# Patient Record
Sex: Female | Born: 1980 | Race: White | Hispanic: No | Marital: Single | Smoking: Never smoker
Health system: Southern US, Community
[De-identification: ages and names within clinical notes are randomized; demographics above are authoritative.]

## PROBLEM LIST (undated history)

## (undated) DIAGNOSIS — I639 Cerebral infarction, unspecified: Secondary | ICD-10-CM

---

## 2018-06-23 DIAGNOSIS — I6350 Cerebral infarction due to unspecified occlusion or stenosis of unspecified cerebral artery: Secondary | ICD-10-CM

## 2018-06-24 DIAGNOSIS — I679 Cerebrovascular disease, unspecified: Secondary | ICD-10-CM

## 2018-06-24 DIAGNOSIS — I639 Cerebral infarction, unspecified: Secondary | ICD-10-CM

## 2018-06-24 DIAGNOSIS — I6789 Other cerebrovascular disease: Secondary | ICD-10-CM | POA: Diagnosis not present

## 2018-06-25 DIAGNOSIS — I639 Cerebral infarction, unspecified: Secondary | ICD-10-CM | POA: Diagnosis not present

## 2018-06-25 DIAGNOSIS — I679 Cerebrovascular disease, unspecified: Secondary | ICD-10-CM | POA: Diagnosis not present

## 2019-09-10 ENCOUNTER — Emergency Department
Admission: EM | Admit: 2019-09-10 | Discharge: 2019-09-11 | Disposition: A | Discharge status: Home / Self Care | Payer: Medicaid Other | Attending: Emergency Medicine | Admitting: Emergency Medicine

## 2019-09-10 ENCOUNTER — Emergency Department: Payer: Medicaid Other

## 2019-09-10 ENCOUNTER — Other Ambulatory Visit: Payer: Self-pay

## 2019-09-10 DIAGNOSIS — F1910 Other psychoactive substance abuse, uncomplicated: Secondary | ICD-10-CM | POA: Insufficient documentation

## 2019-09-10 DIAGNOSIS — R4182 Altered mental status, unspecified: Secondary | ICD-10-CM | POA: Insufficient documentation

## 2019-09-10 DIAGNOSIS — T438X1A Poisoning by other psychotropic drugs, accidental (unintentional), initial encounter: Secondary | ICD-10-CM | POA: Diagnosis present

## 2019-09-10 LAB — COMPREHENSIVE METABOLIC PANEL
ALT: 26 U/L (ref 0–44)
AST: 29 U/L (ref 15–41)
Albumin: 3.5 g/dL (ref 3.5–5.0)
Alkaline Phosphatase: 147 U/L — ABNORMAL HIGH (ref 38–126)
Anion gap: 7 (ref 5–15)
BUN: 16 mg/dL (ref 6–20)
CO2: 29 mmol/L (ref 22–32)
Calcium: 9.1 mg/dL (ref 8.9–10.3)
Chloride: 105 mmol/L (ref 98–111)
Creatinine, Ser: 0.9 mg/dL (ref 0.44–1.00)
GFR calc Af Amer: >60 mL/min (ref >60)
GFR calc non Af Amer: >60 mL/min (ref >60)
Glucose, Bld: 127 mg/dL — ABNORMAL HIGH (ref 70–99)
Potassium: 4 mmol/L (ref 3.5–5.1)
Sodium: 141 mmol/L (ref 135–145)
Total Bilirubin: 0.9 mg/dL (ref 0.3–1.2)
Total Protein: 6.9 g/dL (ref 6.5–8.1)

## 2019-09-10 LAB — CBC
HCT: 37.4 % (ref 36.0–46.0)
Hemoglobin: 11.8 g/dL — ABNORMAL LOW (ref 12.0–15.0)
MCH: 27.2 pg (ref 26.0–34.0)
MCHC: 31.6 g/dL (ref 30.0–36.0)
MCV: 86.2 fL (ref 80.0–100.0)
Platelets: 269 10*3/uL (ref 150–400)
RBC: 4.34 MIL/uL (ref 3.87–5.11)
RDW: 14.2 % (ref 11.5–15.5)
WBC: 6.8 10*3/uL (ref 4.0–10.5)
nRBC: 0 % (ref 0.0–0.2)

## 2019-09-10 LAB — ACETAMINOPHEN LEVEL: Acetaminophen (Tylenol), Serum: <10 ug/mL — ABNORMAL LOW (ref 10–30)

## 2019-09-10 LAB — ETHANOL: Alcohol, Ethyl (B): <10 mg/dL (ref <10)

## 2019-09-10 LAB — SALICYLATE LEVEL: Salicylate Lvl: <7 mg/dL (ref 2.8–30.0)

## 2019-09-10 IMAGING — CT CT CERVICAL SPINE W/O CM
3 of 4 series · 9 of 33 positions shown, 10 images · non-contrast
Comparison: [DATE]

CLINICAL DATA: Altered level consciousness, reports of overdose

EXAM:
CT HEAD WITHOUT CONTRAST
TECHNIQUE: Contiguous axial images were obtained from the base of the skull
through the vertex without intravenous contrast.

[Series 6: sagittal bone · sagittal · 0.20mm/px · 5 of 62 slices shown]
[im 21/62  bone]
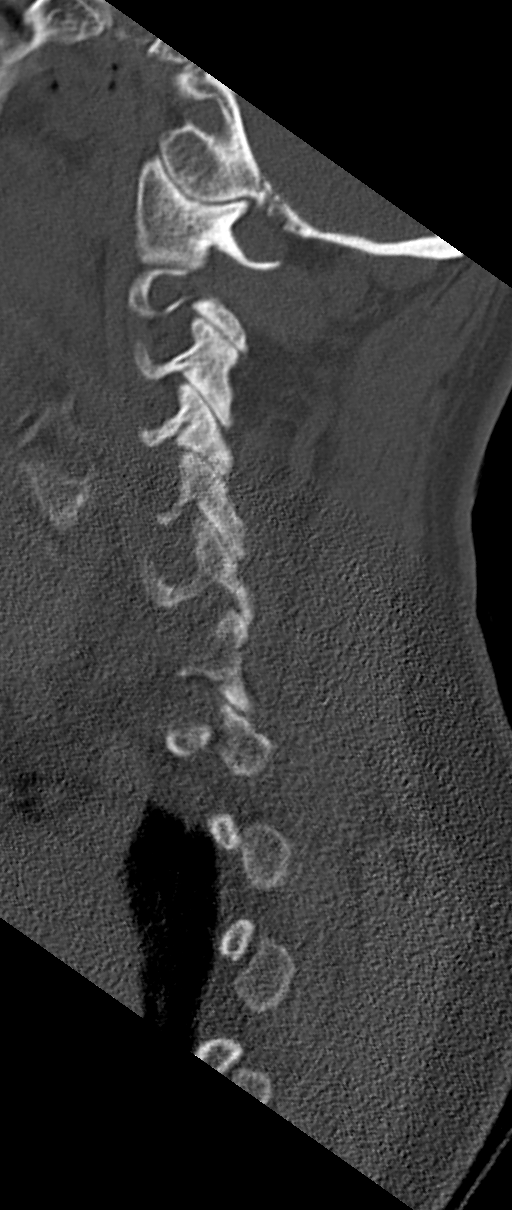
[im 26/62  bone]
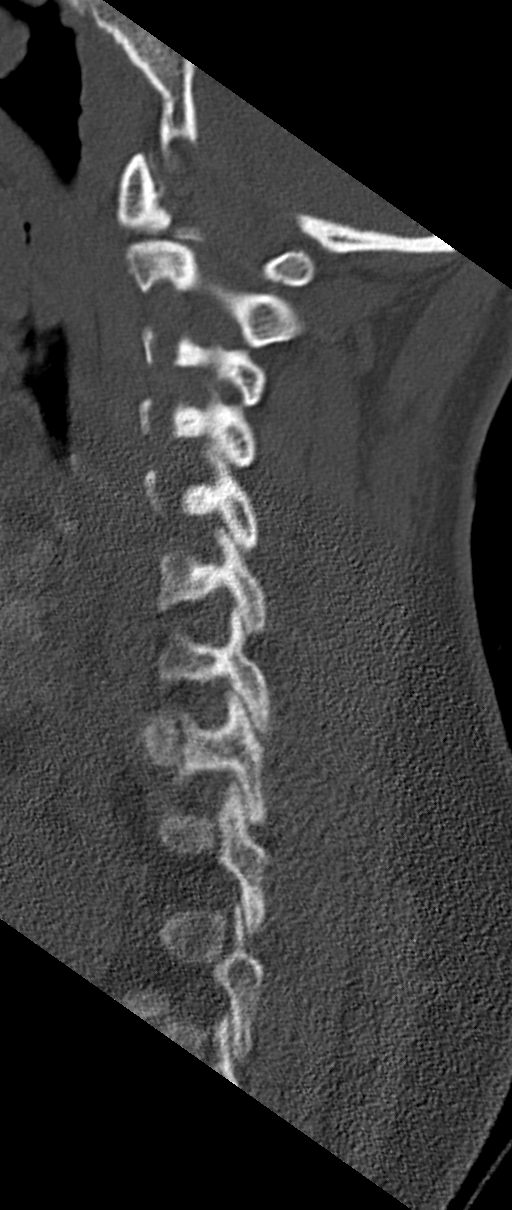
[im 31/62  bone]
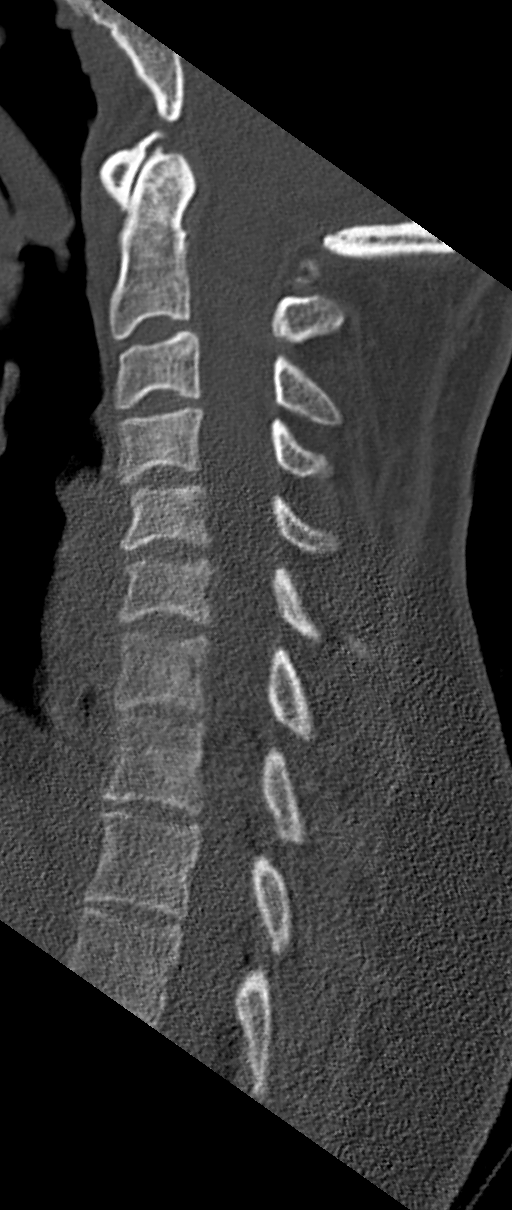
[im 36/62  bone]
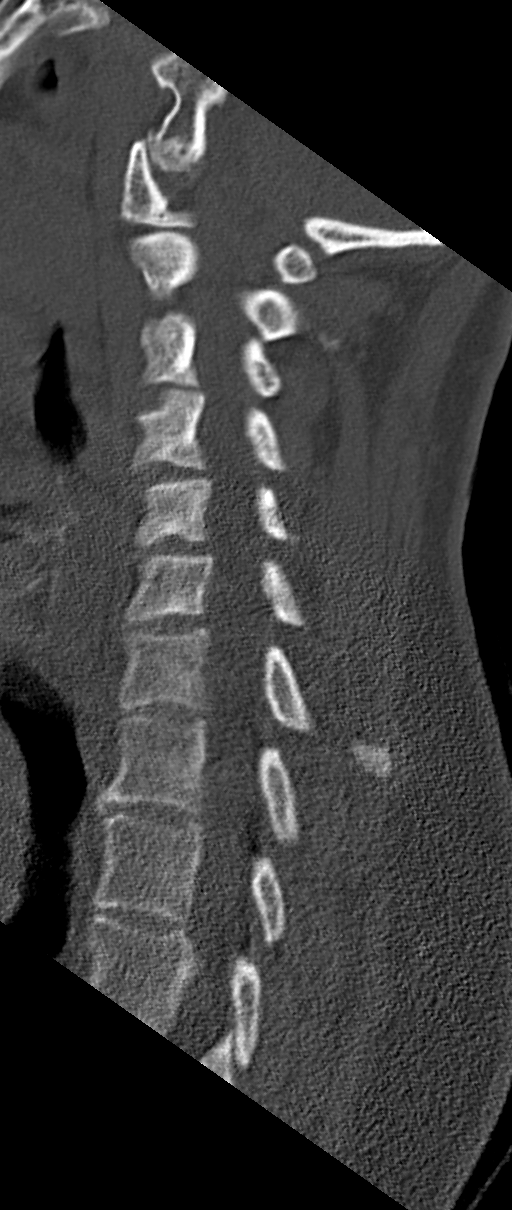
[im 41/62  bone]
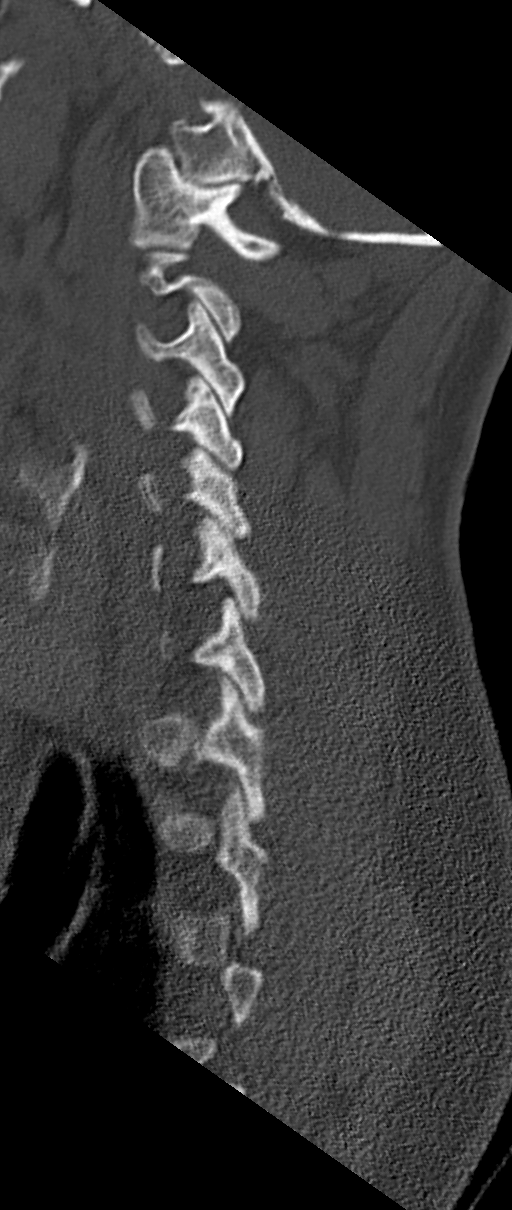

[Series 7: coronal bone · coronal · 0.24mm/px · 3 of 52 slices shown]
[im 13/52  bone]
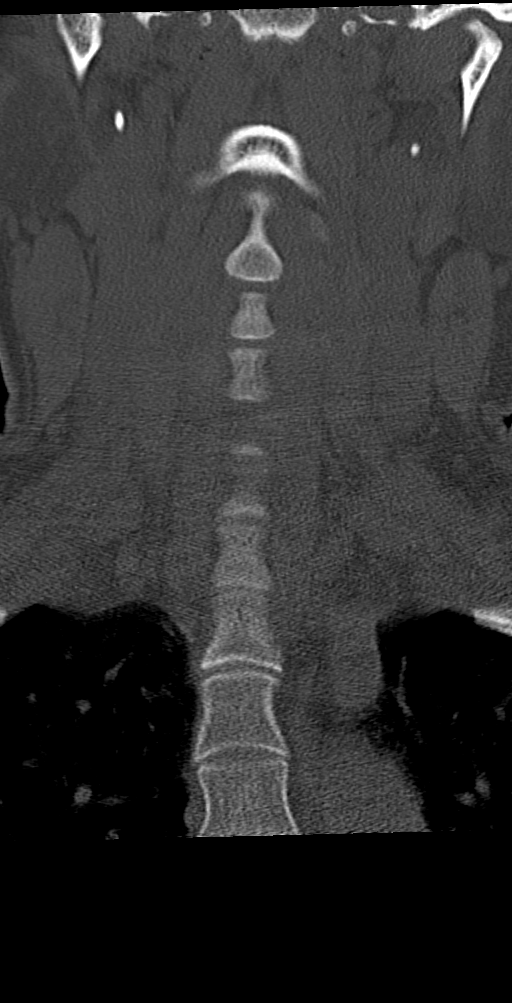
[im 22/52  bone]
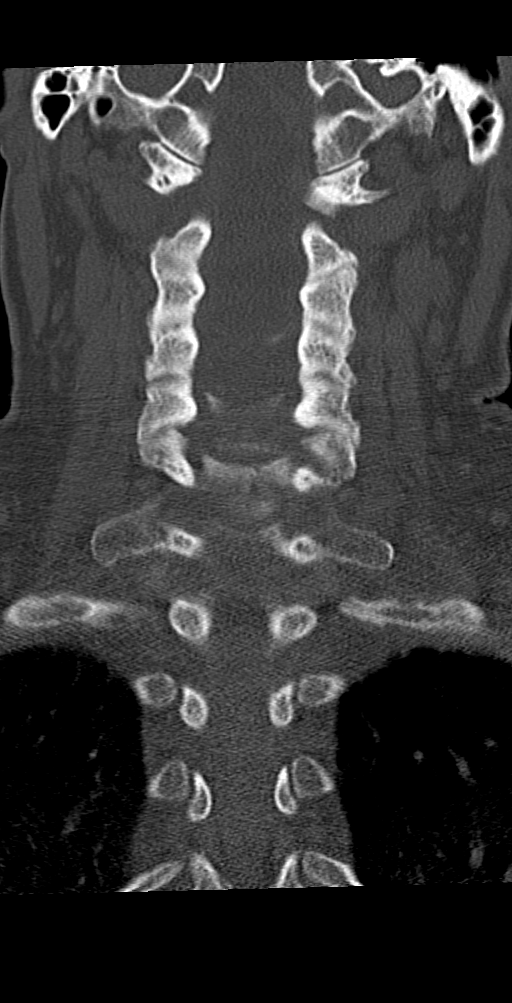
[im 30/52  bone]
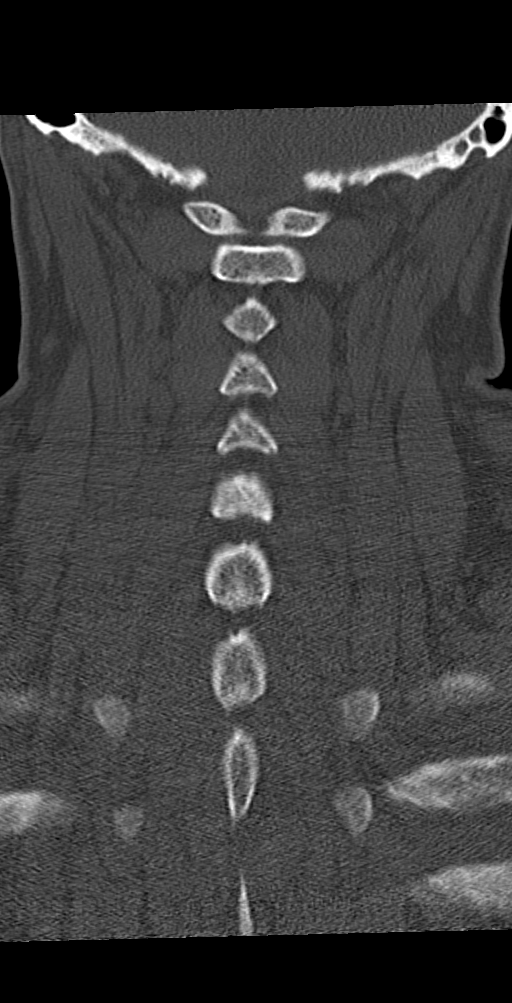

[Series 8: orthogonal bone · axial · 0.20mm/px · z∈[+229,+229]mm · 1 of 121 slices shown, 2 images]
[im 69/121  soft-tissue]
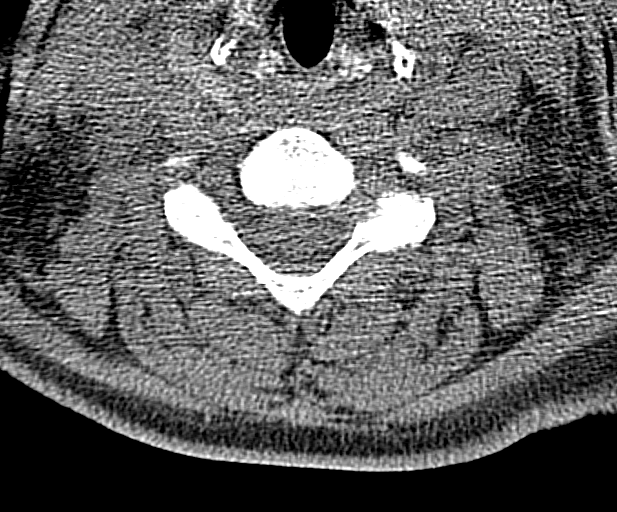
[im 69/121  bone]
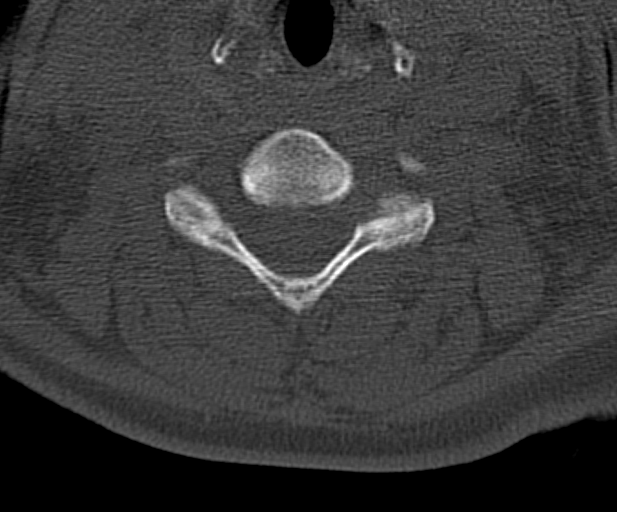

[9 of 33 positions shown; findings below may reference images not displayed]

FINDINGS: Brain: No evidence of acute territorial infarction, hemorrhage,
hydrocephalus,extra-axial collection or mass lesion/mass effect.
Normal gray-white differentiation. Ventricles are normal in size and
contour.

Vascular: No hyperdense vessel or unexpected calcification.

Skull: The skull is intact. No fracture or focal lesion identified.

Sinuses/Orbits: Ethmoid air cell mucosal thickening is seen. The
orbits and globes intact.

Other: None

Cervical spine:

Alignment: Straightening of the normal cervical lordosis.

Skull base and vertebrae: Visualized skull base is intact. No
atlanto-occipital dissociation. The vertebral body heights are well
maintained. No fracture or pathologic osseous lesion seen.

Soft tissues and spinal canal: The visualized paraspinal soft
tissues are unremarkable. No prevertebral soft tissue swelling is
seen. The spinal canal is grossly unremarkable, no large epidural
collection or significant canal narrowing.

Disc levels:  No significant canal or neural foraminal narrowing.

Upper chest: The lung apices are clear. Thoracic inlet is within
normal limits.

Other: None
IMPRESSION: 1. No acute intracranial abnormality.
2.  No acute fracture or malalignment of the spine.
3. Ethmoid air cell mucosal thickening

## 2019-09-10 IMAGING — CT CT HEAD W/O CM
3 series · 15 of 47 positions shown, 18 images · non-contrast
Comparison: [DATE]

CLINICAL DATA: Altered level consciousness, reports of overdose

EXAM:
CT HEAD WITHOUT CONTRAST
TECHNIQUE: Contiguous axial images were obtained from the base of the skull
through the vertex without intravenous contrast.

[Series 3: head wo · axial · 0.42mm/px · z∈[+341,+466]mm · 9 of 30 slices shown, 12 images]
[im 3/30  brain]
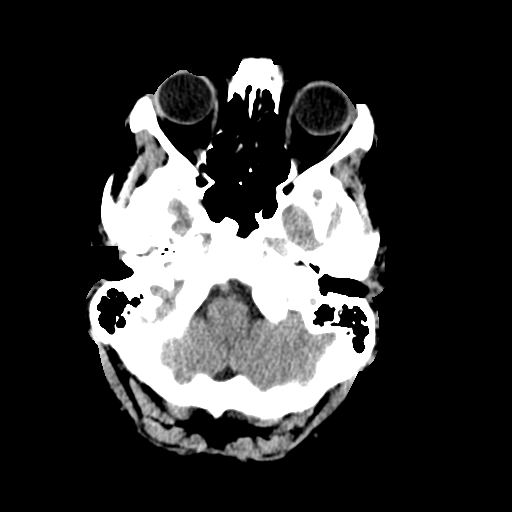
[im 3/30  bone]
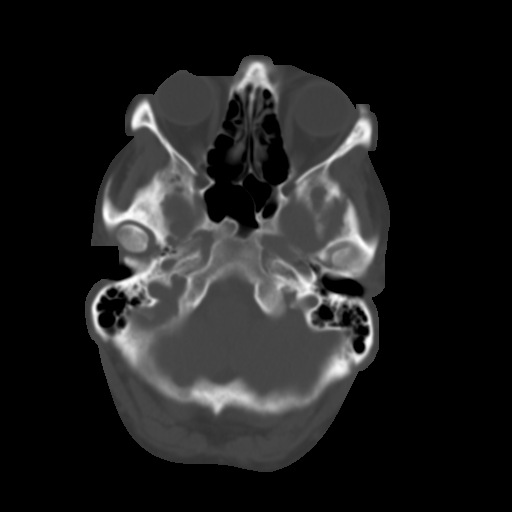
[im 6/30  brain]
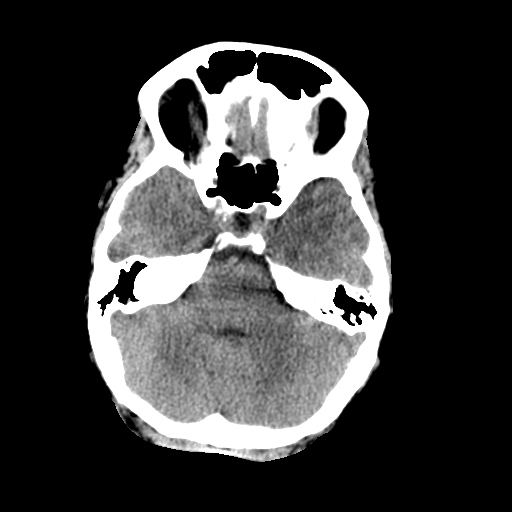
[im 9/30  brain]
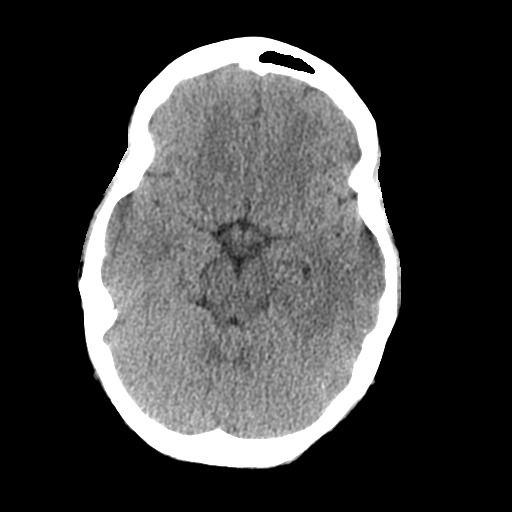
[im 12/30  brain]
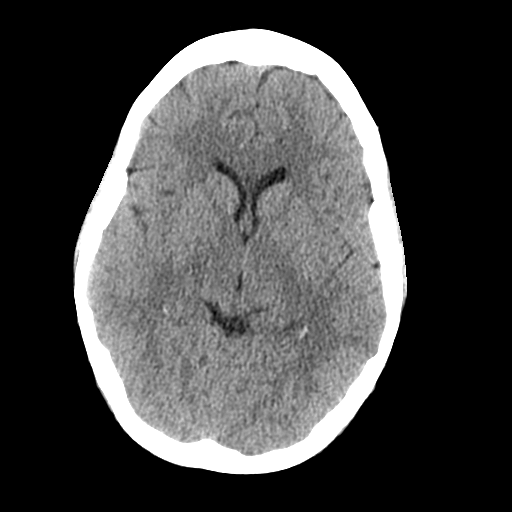
[im 16/30  brain]
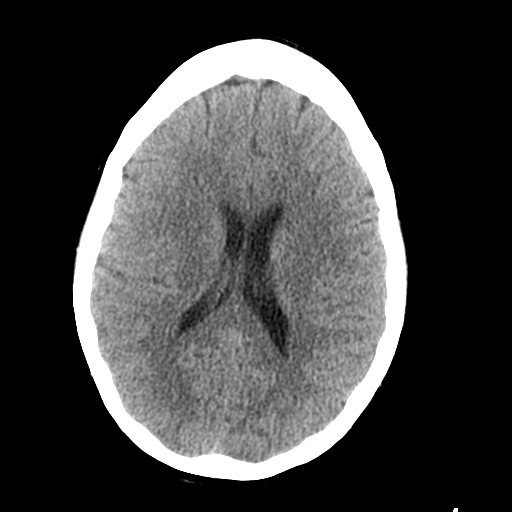
[im 16/30  bone]
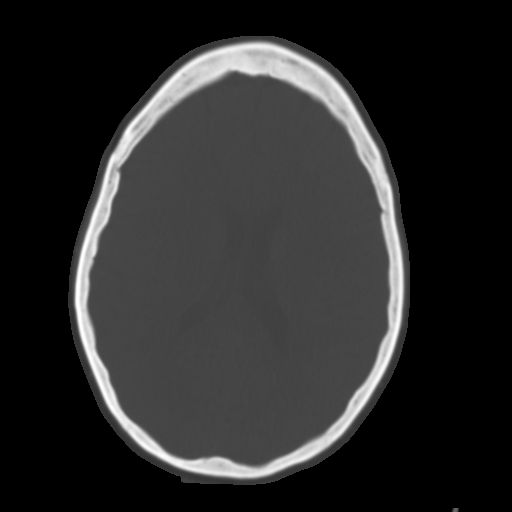
[im 19/30  brain]
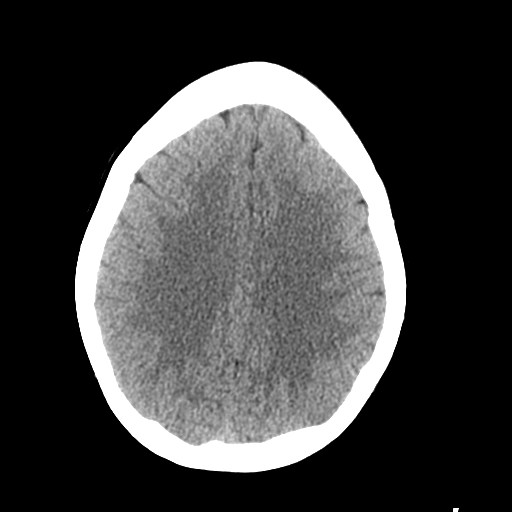
[im 22/30  brain]
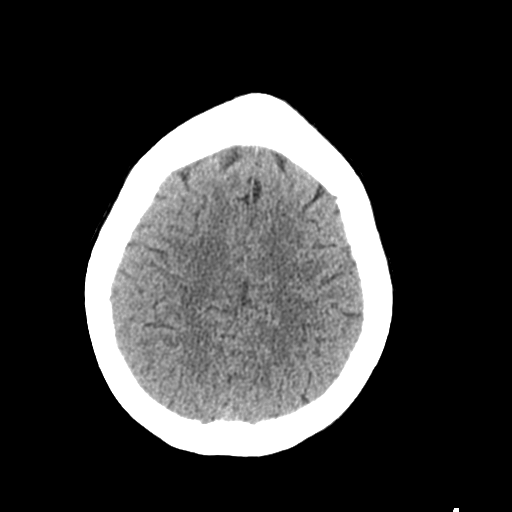
[im 25/30  brain]
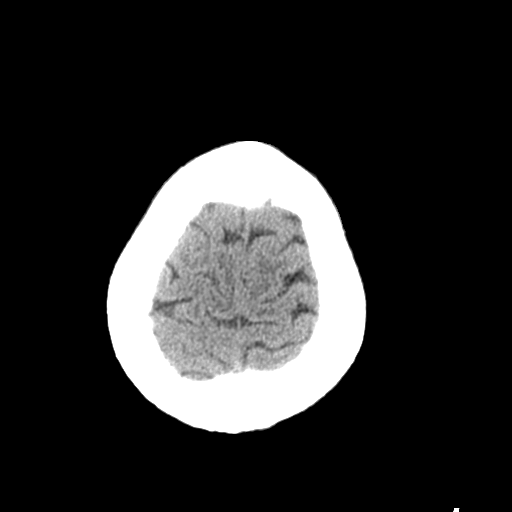
[im 28/30  brain]
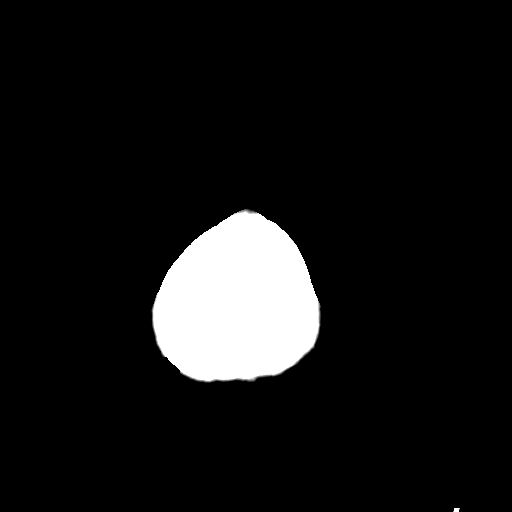
[im 28/30  bone]
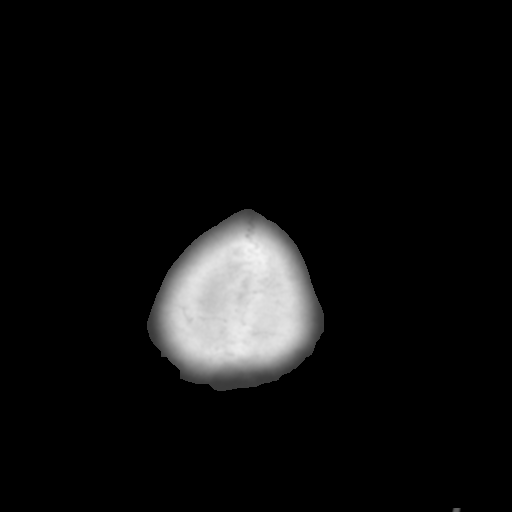

[Series 4: coronal soft tissue · coronal · 0.29mm/px · 3 of 66 slices shown]
[im 23/66  brain]
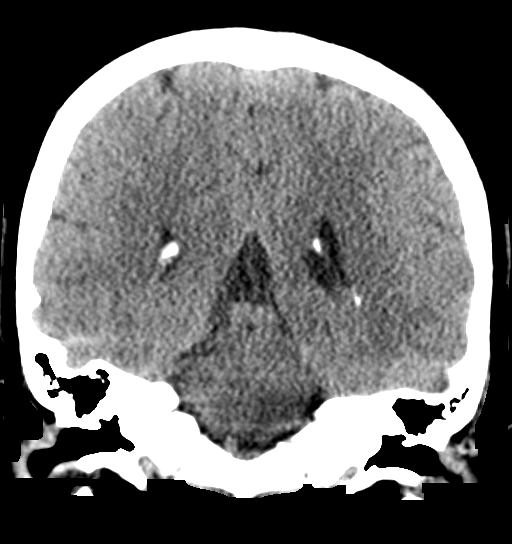
[im 30/66  brain]
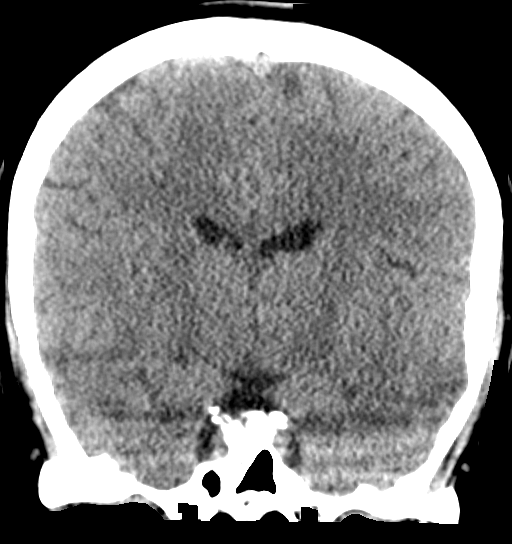
[im 36/66  brain]
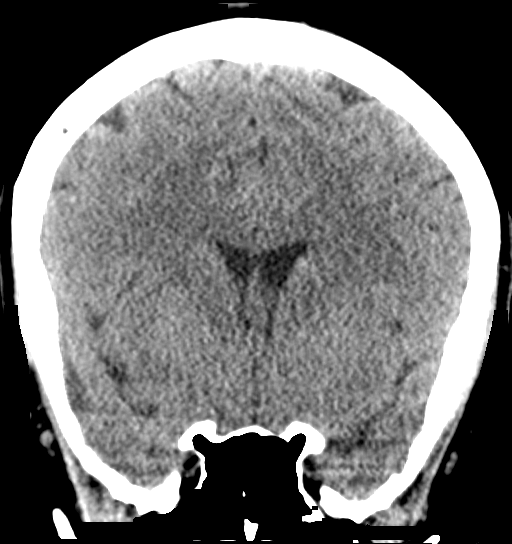

[Series 5: sagittal soft tissue · sagittal · 0.30mm/px · 3 of 49 slices shown]
[im 17/49  brain]
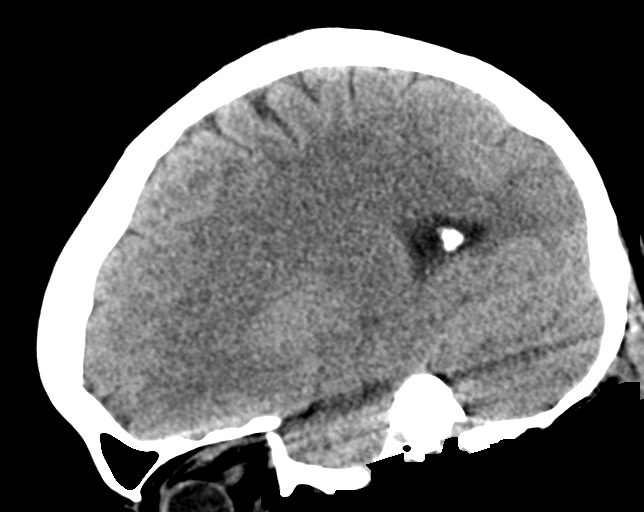
[im 25/49  brain]
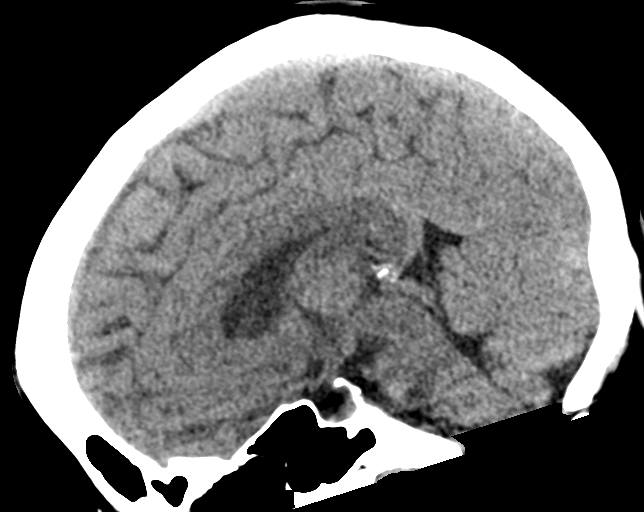
[im 33/49  brain]
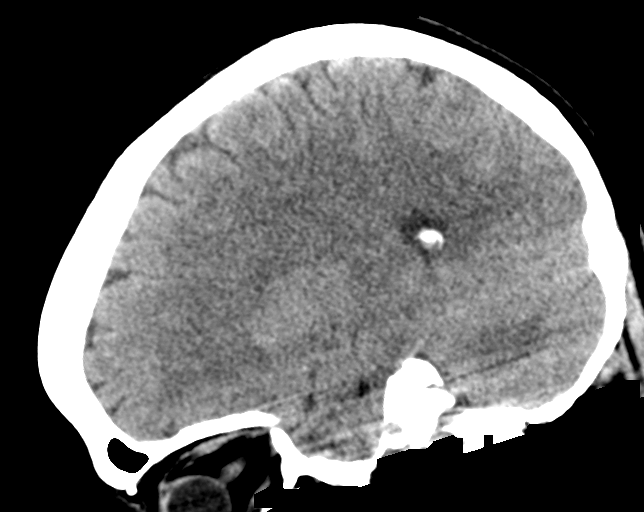

[15 of 47 positions shown; findings below may reference images not displayed]

FINDINGS: Brain: No evidence of acute territorial infarction, hemorrhage,
hydrocephalus,extra-axial collection or mass lesion/mass effect.
Normal gray-white differentiation. Ventricles are normal in size and
contour.

Vascular: No hyperdense vessel or unexpected calcification.

Skull: The skull is intact. No fracture or focal lesion identified.

Sinuses/Orbits: Ethmoid air cell mucosal thickening is seen. The
orbits and globes intact.

Other: None

Cervical spine:

Alignment: Straightening of the normal cervical lordosis.

Skull base and vertebrae: Visualized skull base is intact. No
atlanto-occipital dissociation. The vertebral body heights are well
maintained. No fracture or pathologic osseous lesion seen.

Soft tissues and spinal canal: The visualized paraspinal soft
tissues are unremarkable. No prevertebral soft tissue swelling is
seen. The spinal canal is grossly unremarkable, no large epidural
collection or significant canal narrowing.

Disc levels:  No significant canal or neural foraminal narrowing.

Upper chest: The lung apices are clear. Thoracic inlet is within
normal limits.

Other: None
IMPRESSION: 1. No acute intracranial abnormality.
2.  No acute fracture or malalignment of the spine.
3. Ethmoid air cell mucosal thickening

## 2019-09-10 MED ORDER — SODIUM CHLORIDE 0.9 % IV BOLUS
1000.0000 mL | Freq: Once | INTRAVENOUS | Status: AC
  Administered 2019-09-10: 1000 mL via INTRAVENOUS

## 2019-09-10 NOTE — ED Provider Notes (Signed)
[facility] Emergency Department Provider Note  ____________________________________________   First MD Initiated Contact with Patient 09/10/19 1856     (approximate)  I have reviewed the triage vital signs and the nursing notes.   HISTORY  Chief Complaint Drug Overdose    HPI Krystal Sherman is a 38 y.o. female with substance abuse who presents with overdose. Pt was dropped off at [facility] outlets few hours ago and found unresponsive in bathroom prior to arrival.  She says she took 2 percocets and 2 xanax.  History of heroin use this morning as well? Police found cocaine and MJ on her. Denies SI.   History limited due to patient's altered mental status due to use supposed drug use.   Medical and surgery history limited due to altered mental status.  Social history is positive for drug use.  Unclear if any alcohol use.     Review of Systems Limited due to patient's altered mental status. ____________________________________________   PHYSICAL EXAM:  VITAL SIGNS: ED Triage Vitals  Enc Vitals Group     BP 09/10/19 1855 123/81     Pulse Rate 09/10/19 1855 (!) 107     Resp 09/10/19 1855 16     Temp 09/10/19 1855 (!) 97.5 F (36.4 C)     Temp Source 09/10/19 1855 Oral     SpO2 09/10/19 1850 97 %     Weight 09/10/19 1856 180 lb (81.6 kg)     Height 09/10/19 1856 5\' 6"  (1.676 m)     Head Circumference --      Peak Flow --      Pain Score 09/10/19 1855 0     Pain Loc --      Pain Edu? --      Excl. in GC? --     Constitutional: Patient is sleepy but awakens to voice and is able to answer questions. Eyes: Conjunctivae are normal. EOMI. Head: Atraumatic. Nose: No congestion/rhinnorhea. Mouth/Throat: Mucous membranes are moist.   Neck: No stridor. Trachea Midline. FROM Cardiovascular: tachycardiac, regular rhythm. Grossly normal heart sounds.  Good peripheral circulation. Respiratory: Normal respiratory effort.  No retractions. Lungs  CTAB. Gastrointestinal: Soft and nontender. No distention. No abdominal bruits.  Musculoskeletal: No lower extremity tenderness nor edema.  No joint effusions.  Track marks on her arms no erythema Neurologic:  No gross focal neurologic deficits are appreciated.  Able to move all extremities Skin:  Skin is warm, dry and intact. No rash noted. Psychiatric: Appears under the influence, denies SI GU: Deferred   ____________________________________________   LABS (all labs ordered are listed, but only abnormal results are displayed)  Labs Reviewed  COMPREHENSIVE METABOLIC PANEL - Abnormal; Notable for the following components:      Result Value   Glucose, Bld 127 (*)    Alkaline Phosphatase 147 (*)    All other components within normal limits  ACETAMINOPHEN LEVEL - Abnormal; Notable for the following components:   Acetaminophen (Tylenol), Serum <10 (*)    All other components within normal limits  CBC - Abnormal; Notable for the following components:   Hemoglobin 11.8 (*)    All other components within normal limits  ETHANOL  SALICYLATE LEVEL  URINE DRUG SCREEN, QUALITATIVE ([facility] ONLY)  CBG MONITORING, ED  POC URINE PREG, ED   ____________________________________________   ED ECG REPORT I, Concha SeMary E Aquilla Voiles, the attending physician, personally viewed and interpreted this ECG.  EKG is sinus tachycardia rate of 105, no ST elevation, no T  wave inversion, normal intervals ____________________________________________  RADIOLOGY   Official radiology report(s): Ct Head Wo Contrast  Result Date: 09/10/2019 CLINICAL DATA:  Altered level consciousness, reports of overdose EXAM: CT HEAD WITHOUT CONTRAST TECHNIQUE: Contiguous axial images were obtained from the base of the skull through the vertex without intravenous contrast. COMPARISON:  June 23, 2018 FINDINGS: Brain: No evidence of acute territorial infarction, hemorrhage, hydrocephalus,extra-axial collection or mass lesion/mass effect.  Normal gray-white differentiation. Ventricles are normal in size and contour. Vascular: No hyperdense vessel or unexpected calcification. Skull: The skull is intact. No fracture or focal lesion identified. Sinuses/Orbits: Ethmoid air cell mucosal thickening is seen. The orbits and globes intact. Other: None Cervical spine: Alignment: Straightening of the normal cervical lordosis. Skull base and vertebrae: Visualized skull base is intact. No atlanto-occipital dissociation. The vertebral body heights are well maintained. No fracture or pathologic osseous lesion seen. Soft tissues and spinal canal: The visualized paraspinal soft tissues are unremarkable. No prevertebral soft tissue swelling is seen. The spinal canal is grossly unremarkable, no large epidural collection or significant canal narrowing. Disc levels:  No significant canal or neural foraminal narrowing. Upper chest: The lung apices are clear. Thoracic inlet is within normal limits. Other: None IMPRESSION: 1. No acute intracranial abnormality. 2.  No acute fracture or malalignment of the spine. 3. Ethmoid air cell mucosal thickening Electronically Signed   By: Jonna Clark M.D.   On: 09/10/2019 21:27   Ct Cervical Spine Wo Contrast  Result Date: 09/10/2019 CLINICAL DATA:  Altered level consciousness, reports of overdose EXAM: CT HEAD WITHOUT CONTRAST TECHNIQUE: Contiguous axial images were obtained from the base of the skull through the vertex without intravenous contrast. COMPARISON:  June 23, 2018 FINDINGS: Brain: No evidence of acute territorial infarction, hemorrhage, hydrocephalus,extra-axial collection or mass lesion/mass effect. Normal gray-white differentiation. Ventricles are normal in size and contour. Vascular: No hyperdense vessel or unexpected calcification. Skull: The skull is intact. No fracture or focal lesion identified. Sinuses/Orbits: Ethmoid air cell mucosal thickening is seen. The orbits and globes intact. Other: None Cervical  spine: Alignment: Straightening of the normal cervical lordosis. Skull base and vertebrae: Visualized skull base is intact. No atlanto-occipital dissociation. The vertebral body heights are well maintained. No fracture or pathologic osseous lesion seen. Soft tissues and spinal canal: The visualized paraspinal soft tissues are unremarkable. No prevertebral soft tissue swelling is seen. The spinal canal is grossly unremarkable, no large epidural collection or significant canal narrowing. Disc levels:  No significant canal or neural foraminal narrowing. Upper chest: The lung apices are clear. Thoracic inlet is within normal limits. Other: None IMPRESSION: 1. No acute intracranial abnormality. 2.  No acute fracture or malalignment of the spine. 3. Ethmoid air cell mucosal thickening Electronically Signed   By: Jonna Clark M.D.   On: 09/10/2019 21:27    ____________________________________________   PROCEDURES  Procedure(s) performed (including Critical Care):  Procedures   ____________________________________________   INITIAL IMPRESSION / ASSESSMENT AND PLAN / ED COURSE  Krystal Sherman was evaluated in Emergency Department on 09/10/2019 for the symptoms described in the history of present illness. She was evaluated in the context of the global COVID-19 pandemic, which necessitated consideration that the patient might be at risk for infection with the SARS-CoV-2 virus that causes COVID-19. Institutional protocols and algorithms that pertain to the evaluation of patients at risk for COVID-19 are in a state of rapid change based on information released by regulatory bodies including the CDC and federal and state organizations. These policies  and algorithms were followed during the patient's care in the ED.    Patient is a 38 year old who presented with drug overdose.  Patient used multiple substances today and I suspect that her drowsiness is from that.  Will do EKG to evaluate for arrhythmia although  low suspicion.  Will get CT head and CT cervical to rule out any injury given patient was found in the bathroom and unclear if she was on the ground or not.  Will get labs to evaluate for electrolyte abnormalities, AKI.  CT scans are negative.  EKG was normal.  Labs are reassuring.  Patient will need sober reevaluation to make sure there is no SI attempt.  Reevaluated patient still sleepy although denies SI although not a great historian at this time.  Patient handed off to incoming team pending sober reevaluation, pregnancy test, urine and making sure that she does not have any SI and discharge home if work-up is otherwise reassuring.   ____________________________________________   FINAL CLINICAL IMPRESSION(S) / ED DIAGNOSES   Final diagnoses:  Polysubstance abuse ([place])      MEDICATIONS GIVEN DURING THIS VISIT:  Medications  sodium chloride 0.9 % bolus 1,000 mL (1,000 mLs Intravenous New Bag/Given 09/10/19 2015)     ED Discharge Orders    None       Note:  This document was prepared using Dragon voice recognition software and may include unintentional dictation errors.   Vanessa Crowley, MD 09/10/19 2300

## 2019-09-10 NOTE — ED Triage Notes (Addendum)
Pt arrived via EMS from [facility] with reports of overdose. Pt was found on toilet unresponsive after being left at the outlets for several hours.  Per pt she took 2 Perocect 10s and 2 "blue" Xanax.  Pt does participate in needle exchange program and uses heroin. Pt states she last used heroin this morning.  Pt is alert on arrival but is frequently drowsy.   Per EMS, PD found cocaine and MJ on her. Pt denies this.

## 2019-09-10 NOTE — ED Notes (Signed)
Pt to CT

## 2019-09-10 NOTE — Discharge Instructions (Signed)
Your ultra mental status is most likely secondary to drug use.  You should cut down on using substances because it can be harmful to you.  Return to the ER for any other concerns.

## 2019-09-10 NOTE — ED Notes (Signed)
Per MD urinalysis can wait until pt wakes up.

## 2019-09-11 NOTE — ED Notes (Signed)
Pt left without this RN able to do final DC VS or DC signature.

## 2019-09-11 NOTE — ED Notes (Signed)
Pt left belongings in labeled bag at front desk.

## 2019-09-11 NOTE — ED Notes (Signed)
Pt seen walking out of room without notification to RN. Pt removed IV- IV cath found lying on bed intact. MD made aware.

## 2019-09-11 NOTE — ED Provider Notes (Signed)
Procedures     ----------------------------------------- 4:44 AM on 09/11/2019 ----------------------------------------- Patient woke up, ambulated with steady gait and was observed eloping from the ED.  She has not made any indication that she poses any danger to herself or others.  Appears to be an accidental event.  I had recently reevaluated the patient at about 4:00 AM, found her to have a normal respiratory rate, good air entry in all lung fields, no evidence of pulmonary edema or persistent intoxication.  She appears to be sober and and has medical decision-making capacity.     Carrie Mew, MD 09/11/19 [phone]

## 2021-04-13 DIAGNOSIS — I517 Cardiomegaly: Secondary | ICD-10-CM | POA: Diagnosis not present

## 2022-01-28 ENCOUNTER — Other Ambulatory Visit: Payer: Self-pay

## 2022-01-28 ENCOUNTER — Inpatient Hospital Stay (HOSPITAL_COMMUNITY)
Admission: EM | Admit: 2022-01-28 | Discharge: 2022-02-05 | DRG: 091 | Disposition: A | Discharge status: Home / Self Care | Payer: Medicare Other | Attending: Internal Medicine | Admitting: Internal Medicine

## 2022-01-28 DIAGNOSIS — I675 Moyamoya disease: Principal | ICD-10-CM | POA: Diagnosis present

## 2022-01-28 DIAGNOSIS — E785 Hyperlipidemia, unspecified: Secondary | ICD-10-CM | POA: Diagnosis present

## 2022-01-28 DIAGNOSIS — R270 Ataxia, unspecified: Secondary | ICD-10-CM | POA: Diagnosis present

## 2022-01-28 DIAGNOSIS — I63411 Cerebral infarction due to embolism of right middle cerebral artery: Secondary | ICD-10-CM | POA: Diagnosis present

## 2022-01-28 DIAGNOSIS — Z20822 Contact with and (suspected) exposure to covid-19: Secondary | ICD-10-CM | POA: Diagnosis present

## 2022-01-28 DIAGNOSIS — R29709 NIHSS score 9: Secondary | ICD-10-CM | POA: Diagnosis present

## 2022-01-28 DIAGNOSIS — I161 Hypertensive emergency: Secondary | ICD-10-CM | POA: Diagnosis present

## 2022-01-28 DIAGNOSIS — E538 Deficiency of other specified B group vitamins: Secondary | ICD-10-CM | POA: Diagnosis present

## 2022-01-28 DIAGNOSIS — R4701 Aphasia: Secondary | ICD-10-CM | POA: Diagnosis present

## 2022-01-28 DIAGNOSIS — I10 Essential (primary) hypertension: Secondary | ICD-10-CM

## 2022-01-28 DIAGNOSIS — Z823 Family history of stroke: Secondary | ICD-10-CM

## 2022-01-28 DIAGNOSIS — Q2112 Patent foramen ovale: Secondary | ICD-10-CM

## 2022-01-28 DIAGNOSIS — H5347 Heteronymous bilateral field defects: Secondary | ICD-10-CM | POA: Diagnosis present

## 2022-01-28 DIAGNOSIS — I69331 Monoplegia of upper limb following cerebral infarction affecting right dominant side: Secondary | ICD-10-CM

## 2022-01-28 DIAGNOSIS — F1510 Other stimulant abuse, uncomplicated: Secondary | ICD-10-CM | POA: Diagnosis present

## 2022-01-28 DIAGNOSIS — G8194 Hemiplegia, unspecified affecting left nondominant side: Secondary | ICD-10-CM | POA: Diagnosis present

## 2022-01-28 DIAGNOSIS — F1410 Cocaine abuse, uncomplicated: Secondary | ICD-10-CM | POA: Diagnosis present

## 2022-01-28 DIAGNOSIS — I3139 Other pericardial effusion (noninflammatory): Secondary | ICD-10-CM | POA: Diagnosis present

## 2022-01-28 DIAGNOSIS — I639 Cerebral infarction, unspecified: Secondary | ICD-10-CM | POA: Diagnosis present

## 2022-01-28 DIAGNOSIS — Z6835 Body mass index (BMI) 35.0-35.9, adult: Secondary | ICD-10-CM

## 2022-01-28 DIAGNOSIS — D6851 Activated protein C resistance: Secondary | ICD-10-CM

## 2022-01-28 DIAGNOSIS — R29810 Facial weakness: Secondary | ICD-10-CM | POA: Diagnosis present

## 2022-01-28 DIAGNOSIS — F17200 Nicotine dependence, unspecified, uncomplicated: Secondary | ICD-10-CM | POA: Diagnosis present

## 2022-01-28 DIAGNOSIS — Z9114 Patient's other noncompliance with medication regimen: Secondary | ICD-10-CM

## 2022-01-28 HISTORY — DX: Cerebral infarction, unspecified: I63.9

## 2022-01-28 NOTE — ED Triage Notes (Signed)
Pt bib [employer] c/o for stroke like symptoms. Symptoms started Monday morning. 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 Pt c/o Lt side weakness. Pt has unequal strength in both arms, slurred speech, pt dragging her Lt leg, blurred vision, and headaches. Pt admits to using meth yesterday.Pt unable to take medication. ? ?EMS vitals ?230/134 bp ?90 NSR ?100% O2 RA ?122 CBG ?

## 2022-01-29 ENCOUNTER — Observation Stay (HOSPITAL_COMMUNITY): Payer: Medicare Other

## 2022-01-29 ENCOUNTER — Encounter (HOSPITAL_COMMUNITY): Payer: Self-pay | Admitting: Internal Medicine

## 2022-01-29 ENCOUNTER — Emergency Department (HOSPITAL_COMMUNITY): Payer: Medicare Other

## 2022-01-29 DIAGNOSIS — Z8673 Personal history of transient ischemic attack (TIA), and cerebral infarction without residual deficits: Secondary | ICD-10-CM | POA: Diagnosis not present

## 2022-01-29 DIAGNOSIS — I6389 Other cerebral infarction: Secondary | ICD-10-CM

## 2022-01-29 DIAGNOSIS — D6851 Activated protein C resistance: Secondary | ICD-10-CM | POA: Diagnosis present

## 2022-01-29 DIAGNOSIS — Q2112 Patent foramen ovale: Secondary | ICD-10-CM | POA: Diagnosis not present

## 2022-01-29 DIAGNOSIS — F1490 Cocaine use, unspecified, uncomplicated: Secondary | ICD-10-CM

## 2022-01-29 DIAGNOSIS — I10 Essential (primary) hypertension: Secondary | ICD-10-CM

## 2022-01-29 DIAGNOSIS — H5347 Heteronymous bilateral field defects: Secondary | ICD-10-CM | POA: Diagnosis present

## 2022-01-29 DIAGNOSIS — E785 Hyperlipidemia, unspecified: Secondary | ICD-10-CM | POA: Diagnosis present

## 2022-01-29 DIAGNOSIS — I675 Moyamoya disease: Secondary | ICD-10-CM | POA: Diagnosis present

## 2022-01-29 DIAGNOSIS — I639 Cerebral infarction, unspecified: Secondary | ICD-10-CM | POA: Diagnosis present

## 2022-01-29 DIAGNOSIS — F1910 Other psychoactive substance abuse, uncomplicated: Secondary | ICD-10-CM

## 2022-01-29 DIAGNOSIS — R29709 NIHSS score 9: Secondary | ICD-10-CM | POA: Diagnosis present

## 2022-01-29 DIAGNOSIS — R4701 Aphasia: Secondary | ICD-10-CM | POA: Diagnosis present

## 2022-01-29 DIAGNOSIS — Z9114 Patient's other noncompliance with medication regimen: Secondary | ICD-10-CM | POA: Diagnosis not present

## 2022-01-29 DIAGNOSIS — I3139 Other pericardial effusion (noninflammatory): Secondary | ICD-10-CM | POA: Diagnosis present

## 2022-01-29 DIAGNOSIS — G8194 Hemiplegia, unspecified affecting left nondominant side: Secondary | ICD-10-CM | POA: Diagnosis present

## 2022-01-29 DIAGNOSIS — F17200 Nicotine dependence, unspecified, uncomplicated: Secondary | ICD-10-CM | POA: Diagnosis present

## 2022-01-29 DIAGNOSIS — Z823 Family history of stroke: Secondary | ICD-10-CM | POA: Diagnosis not present

## 2022-01-29 DIAGNOSIS — Z6835 Body mass index (BMI) 35.0-35.9, adult: Secondary | ICD-10-CM | POA: Diagnosis not present

## 2022-01-29 DIAGNOSIS — F1410 Cocaine abuse, uncomplicated: Secondary | ICD-10-CM | POA: Diagnosis present

## 2022-01-29 DIAGNOSIS — I63411 Cerebral infarction due to embolism of right middle cerebral artery: Secondary | ICD-10-CM | POA: Diagnosis present

## 2022-01-29 DIAGNOSIS — R29810 Facial weakness: Secondary | ICD-10-CM | POA: Diagnosis present

## 2022-01-29 DIAGNOSIS — I161 Hypertensive emergency: Secondary | ICD-10-CM | POA: Diagnosis present

## 2022-01-29 DIAGNOSIS — I69331 Monoplegia of upper limb following cerebral infarction affecting right dominant side: Secondary | ICD-10-CM | POA: Diagnosis not present

## 2022-01-29 DIAGNOSIS — E538 Deficiency of other specified B group vitamins: Secondary | ICD-10-CM | POA: Diagnosis present

## 2022-01-29 DIAGNOSIS — R270 Ataxia, unspecified: Secondary | ICD-10-CM | POA: Diagnosis present

## 2022-01-29 DIAGNOSIS — Z20822 Contact with and (suspected) exposure to covid-19: Secondary | ICD-10-CM | POA: Diagnosis present

## 2022-01-29 DIAGNOSIS — Z91199 Patient's noncompliance with other medical treatment and regimen due to unspecified reason: Secondary | ICD-10-CM

## 2022-01-29 DIAGNOSIS — F1510 Other stimulant abuse, uncomplicated: Secondary | ICD-10-CM | POA: Diagnosis present

## 2022-01-29 DIAGNOSIS — I6930 Unspecified sequelae of cerebral infarction: Secondary | ICD-10-CM

## 2022-01-29 LAB — CBC WITH DIFFERENTIAL/PLATELET
Abs Immature Granulocytes: 0.02 10*3/uL (ref 0.00–0.07)
Basophils Absolute: 0 10*3/uL (ref 0.0–0.1)
Basophils Relative: 0 %
Eosinophils Absolute: 0.2 10*3/uL (ref 0.0–0.5)
Eosinophils Relative: 2 %
HCT: 40.6 % (ref 36.0–46.0)
Hemoglobin: 13.2 g/dL (ref 12.0–15.0)
Immature Granulocytes: 0 %
Lymphocytes Relative: 30 %
Lymphs Abs: 2.8 10*3/uL (ref 0.7–4.0)
MCH: 27.5 pg (ref 26.0–34.0)
MCHC: 32.5 g/dL (ref 30.0–36.0)
MCV: 84.6 fL (ref 80.0–100.0)
Monocytes Absolute: 0.6 10*3/uL (ref 0.1–1.0)
Monocytes Relative: 6 %
Neutro Abs: 5.6 10*3/uL (ref 1.7–7.7)
Neutrophils Relative %: 62 %
Platelets: 218 10*3/uL (ref 150–400)
RBC: 4.8 MIL/uL (ref 3.87–5.11)
RDW: 13.2 % (ref 11.5–15.5)
WBC: 9.1 10*3/uL (ref 4.0–10.5)
nRBC: 0 % (ref 0.0–0.2)

## 2022-01-29 LAB — CBC
HCT: 40.7 % (ref 36.0–46.0)
Hemoglobin: 13.3 g/dL (ref 12.0–15.0)
MCH: 27.9 pg (ref 26.0–34.0)
MCHC: 32.7 g/dL (ref 30.0–36.0)
MCV: 85.3 fL (ref 80.0–100.0)
Platelets: 229 10*3/uL (ref 150–400)
RBC: 4.77 MIL/uL (ref 3.87–5.11)
RDW: 13.2 % (ref 11.5–15.5)
WBC: 7.8 10*3/uL (ref 4.0–10.5)
nRBC: 0 % (ref 0.0–0.2)

## 2022-01-29 LAB — COMPREHENSIVE METABOLIC PANEL
ALT: 16 U/L (ref 0–44)
AST: 19 U/L (ref 15–41)
Albumin: 3.3 g/dL — ABNORMAL LOW (ref 3.5–5.0)
Alkaline Phosphatase: 100 U/L (ref 38–126)
Anion gap: 10 (ref 5–15)
BUN: 9 mg/dL (ref 6–20)
CO2: 23 mmol/L (ref 22–32)
Calcium: 8.8 mg/dL — ABNORMAL LOW (ref 8.9–10.3)
Chloride: 104 mmol/L (ref 98–111)
Creatinine, Ser: 0.9 mg/dL (ref 0.44–1.00)
GFR, Estimated: >60 mL/min (ref >60)
Glucose, Bld: 112 mg/dL — ABNORMAL HIGH (ref 70–99)
Potassium: 3.4 mmol/L — ABNORMAL LOW (ref 3.5–5.1)
Sodium: 137 mmol/L (ref 135–145)
Total Bilirubin: 0.4 mg/dL (ref 0.3–1.2)
Total Protein: 6.5 g/dL (ref 6.5–8.1)

## 2022-01-29 LAB — C-REACTIVE PROTEIN: CRP: 0.9 mg/dL (ref <1.0)

## 2022-01-29 LAB — ECHOCARDIOGRAM COMPLETE
Area-P 1/2: 2.69 cm2
Calc EF: 61.1 %
MV VTI: 3.02 cm2
S' Lateral: 3.5 cm
Single Plane A2C EF: 62.3 %
Single Plane A4C EF: 59.3 %

## 2022-01-29 LAB — LIPID PANEL
Cholesterol: 155 mg/dL (ref 0–200)
HDL: 32 mg/dL — ABNORMAL LOW (ref >40)
LDL Cholesterol: 91 mg/dL (ref 0–99)
Total CHOL/HDL Ratio: 4.8 RATIO
Triglycerides: 160 mg/dL — ABNORMAL HIGH (ref <150)
VLDL: 32 mg/dL (ref 0–40)

## 2022-01-29 LAB — URINALYSIS, ROUTINE W REFLEX MICROSCOPIC
Bilirubin Urine: NEGATIVE
Glucose, UA: NEGATIVE mg/dL
Hgb urine dipstick: NEGATIVE
Ketones, ur: NEGATIVE mg/dL
Nitrite: NEGATIVE
Protein, ur: NEGATIVE mg/dL
Specific Gravity, Urine: 1.019 (ref 1.005–1.030)
pH: 6 (ref 5.0–8.0)

## 2022-01-29 LAB — PROTIME-INR
INR: 0.9 (ref 0.8–1.2)
Prothrombin Time: 12.5 seconds (ref 11.4–15.2)

## 2022-01-29 LAB — RESP PANEL BY RT-PCR (FLU A&B, COVID) ARPGX2
Influenza A by PCR: NEGATIVE
Influenza B by PCR: NEGATIVE
SARS Coronavirus 2 by RT PCR: NEGATIVE

## 2022-01-29 LAB — BASIC METABOLIC PANEL
Anion gap: 8 (ref 5–15)
BUN: 9 mg/dL (ref 6–20)
CO2: 24 mmol/L (ref 22–32)
Calcium: 8.7 mg/dL — ABNORMAL LOW (ref 8.9–10.3)
Chloride: 103 mmol/L (ref 98–111)
Creatinine, Ser: 0.86 mg/dL (ref 0.44–1.00)
GFR, Estimated: >60 mL/min (ref >60)
Glucose, Bld: 149 mg/dL — ABNORMAL HIGH (ref 70–99)
Potassium: 3.7 mmol/L (ref 3.5–5.1)
Sodium: 135 mmol/L (ref 135–145)

## 2022-01-29 LAB — TSH: TSH: 3.436 u[IU]/mL (ref 0.350–4.500)

## 2022-01-29 LAB — ETHANOL: Alcohol, Ethyl (B): <10 mg/dL (ref <10)

## 2022-01-29 LAB — RPR: RPR Ser Ql: NONREACTIVE

## 2022-01-29 LAB — RAPID URINE DRUG SCREEN, HOSP PERFORMED
Amphetamines: POSITIVE — AB
Barbiturates: NOT DETECTED
Benzodiazepines: NOT DETECTED
Cocaine: NOT DETECTED
Opiates: NOT DETECTED
Tetrahydrocannabinol: NOT DETECTED

## 2022-01-29 LAB — PREGNANCY, URINE: Preg Test, Ur: NEGATIVE

## 2022-01-29 LAB — HEMOGLOBIN A1C
Hgb A1c MFr Bld: 4.7 % — ABNORMAL LOW (ref 4.8–5.6)
Mean Plasma Glucose: 88.19 mg/dL

## 2022-01-29 LAB — HIV ANTIBODY (ROUTINE TESTING W REFLEX): HIV Screen 4th Generation wRfx: NONREACTIVE

## 2022-01-29 LAB — SEDIMENTATION RATE: Sed Rate: 17 mm/hr (ref 0–22)

## 2022-01-29 LAB — ANTITHROMBIN III: AntiThromb III Func: 100 % (ref 75–120)

## 2022-01-29 LAB — VITAMIN B12: Vitamin B-12: 317 pg/mL (ref 180–914)

## 2022-01-29 IMAGING — CT CT HEAD W/O CM
4 series · 16 of 47 positions shown, 18 images · non-contrast
Comparison: Prior head CT from [DATE].

CLINICAL DATA: Initial evaluation for left-sided weakness for 2
days.



[Series 3: head bone · axial · 0.45mm/px · z∈[-102,-72]mm · 3 of 77 slices shown]
[im 8/77  bone]
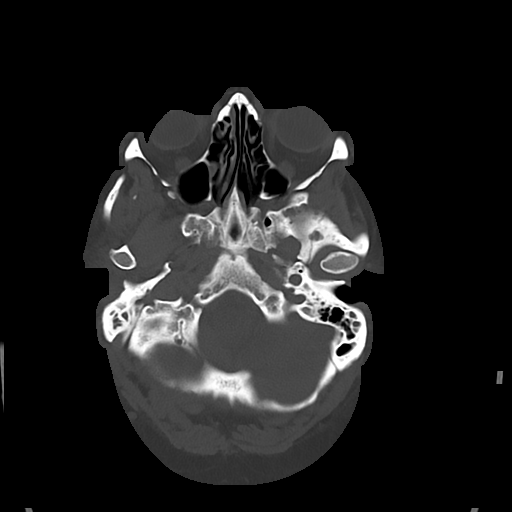
[im 16/77  bone]
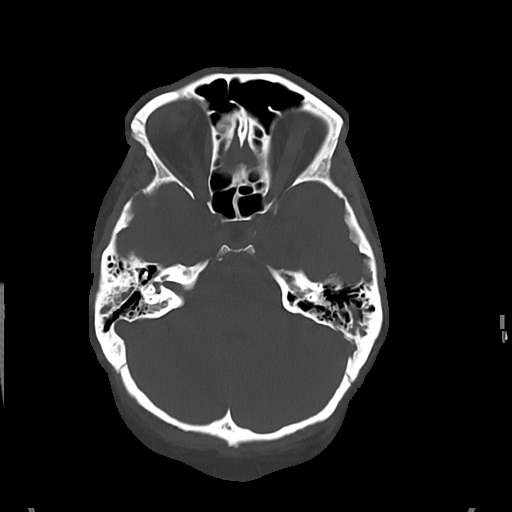
[im 23/77  bone]
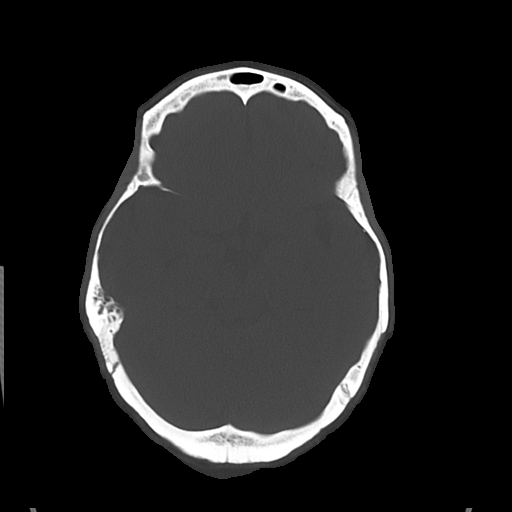

[Series 4: head wo · axial · 0.45mm/px · z∈[-102,+14]mm · 7 of 31 slices shown, 9 images]
[im 4/31  brain]
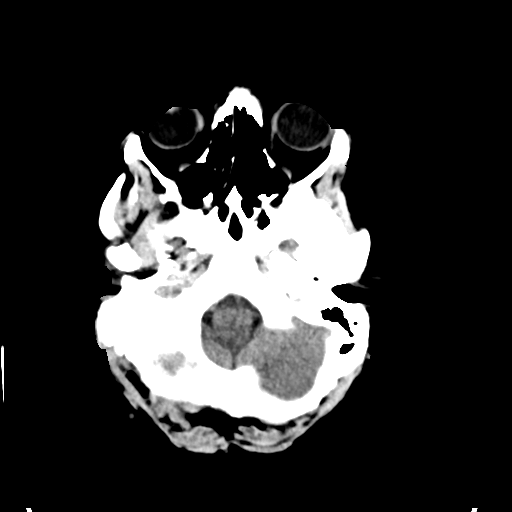
[im 4/31  bone]
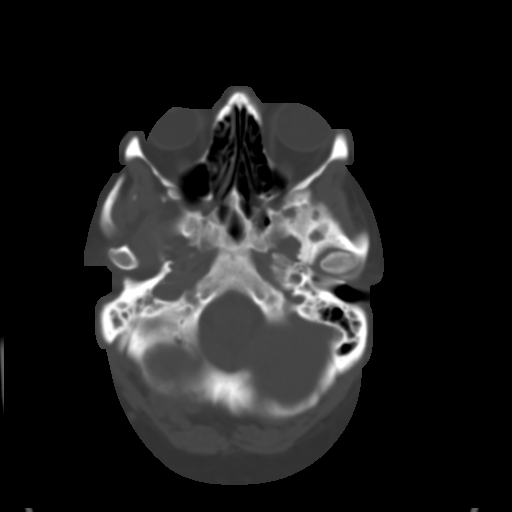
[im 8/31  brain]
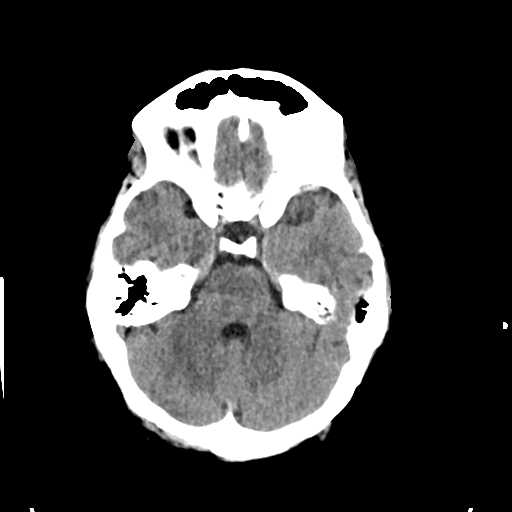
[im 12/31  brain]
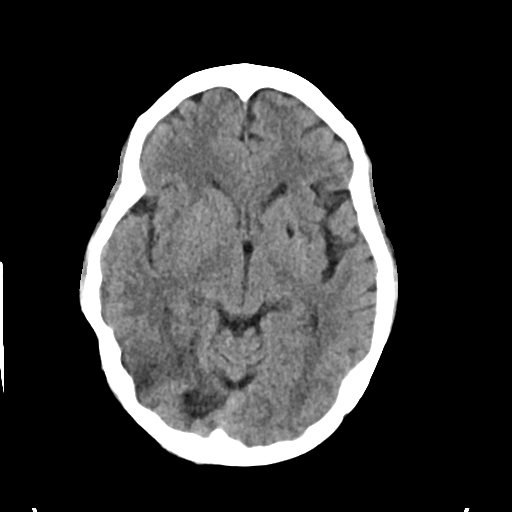
[im 16/31  brain]
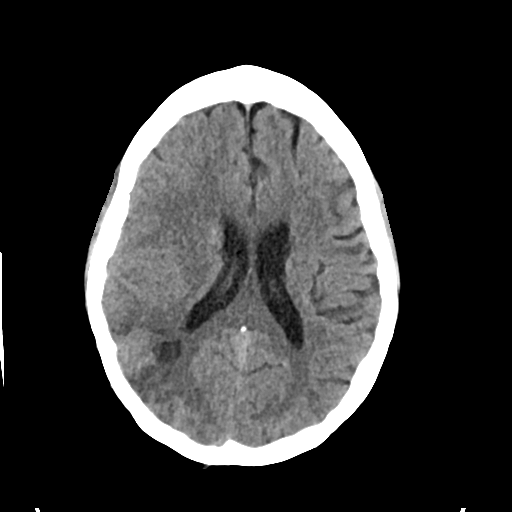
[im 19/31  brain]
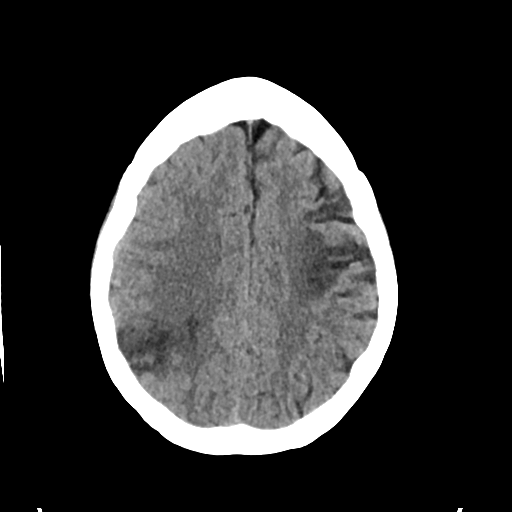
[im 19/31  bone]
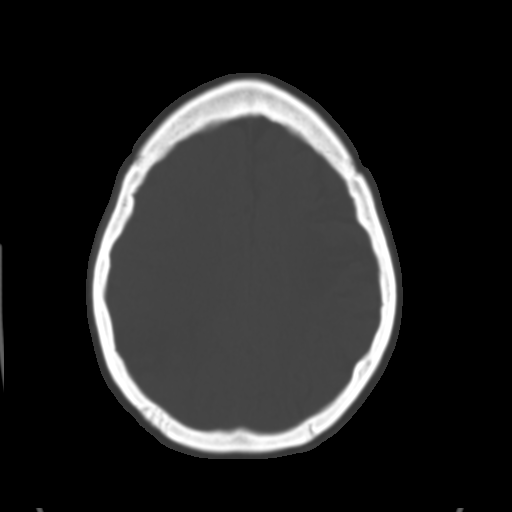
[im 23/31  brain]
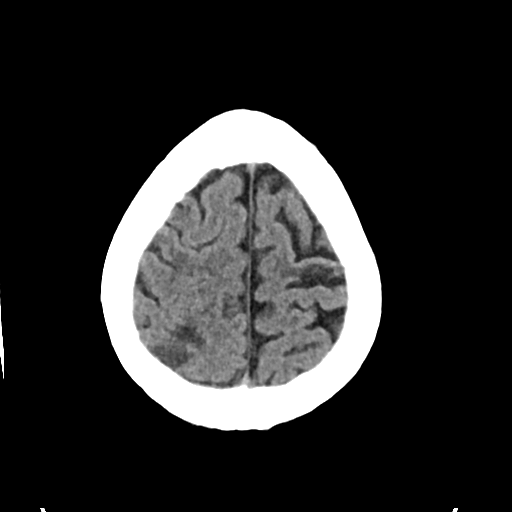
[im 27/31  brain]
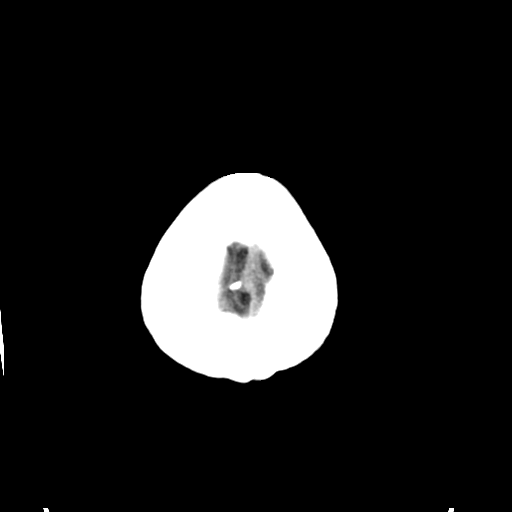

[Series 5: sag soft · sagittal · 0.32mm/px · 3 of 59 slices shown]
[im 20/59  brain]
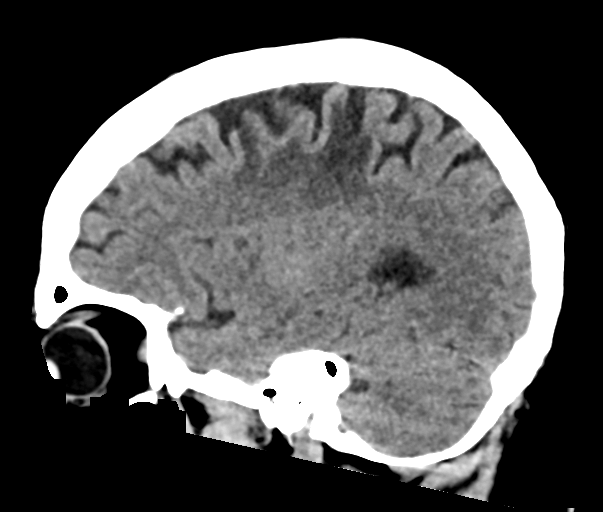
[im 30/59  brain]
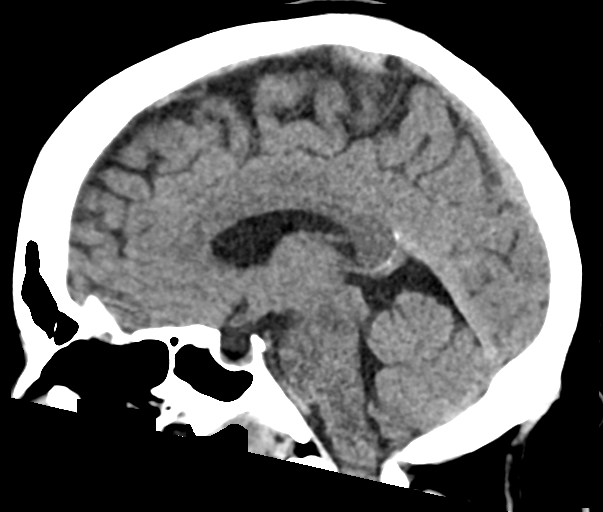
[im 39/59  brain]
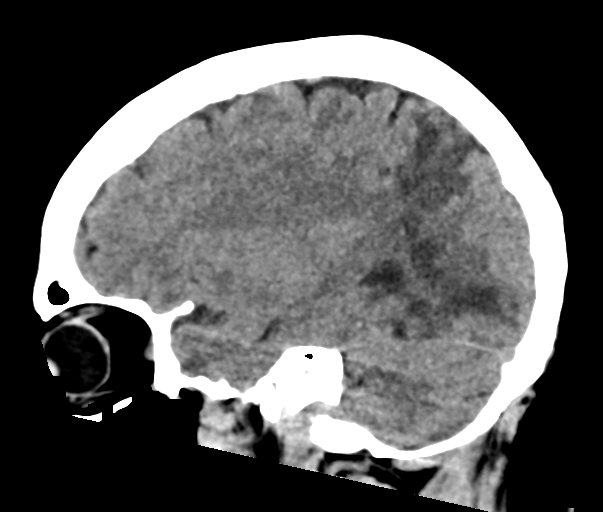

[Series 6: cor soft · coronal · 0.32mm/px · 3 of 65 slices shown]
[im 22/65  brain]
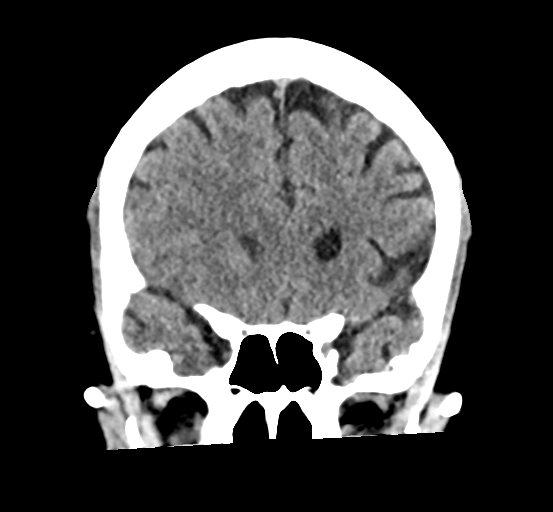
[im 29/65  brain]
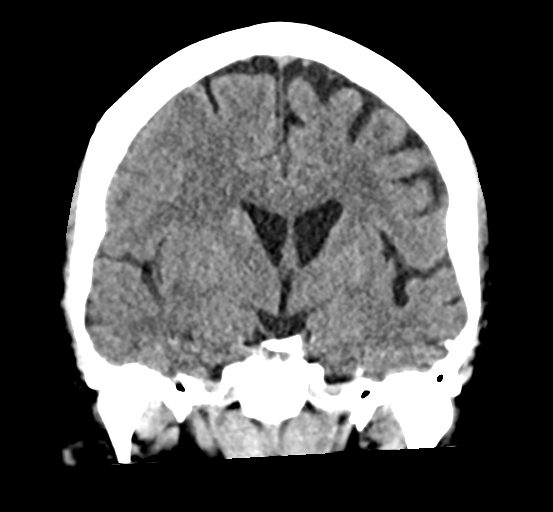
[im 36/65  brain]
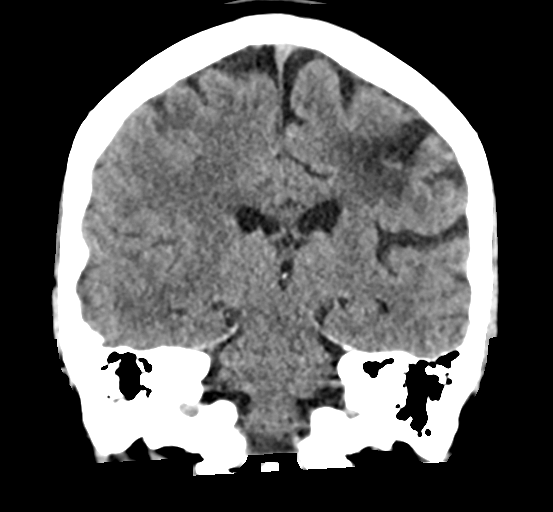

[16 of 47 positions shown; findings below may reference images not displayed]

FINDINGS: Brain: Cerebral volume within normal limits for age. Chronic
encephalomalacia involving the anterior left frontal lobe consistent
with a chronic left MCA distribution infarct. Additional chronic
left basal ganglia infarct noted.

Evolving cytotoxic edema present within the right parieto-occipital
region, consistent with an acute to early subacute right MCA
distribution and/or MCA/PCA watershed distribution infarct.
Suspected faint petechial blood products without frank hemorrhagic
transformation. Localized edema without significant regional mass
effect.

No other acute large vessel territory infarct or intracranial
hemorrhage. No mass lesion or significant midline shift. No
hydrocephalus or extra-axial fluid collection.

Vascular: No hyperdense vessel.

Skull: Scalp soft tissues demonstrate no acute finding. Calvarium
intact.

Sinuses/Orbits: Globes and orbital soft tissues demonstrate no acute
finding. Small right sphenoid sinus retention cyst noted. Paranasal
sinuses are otherwise clear. Moderate right with small left mastoid
effusions.

Other: None.
IMPRESSION: 1. Evolving cytotoxic edema within the right parieto-occipital
region, consistent with an acute to early subacute right MCA and/or
MCA/PCA watershed distribution infarct. Probable associated faint
petechial blood products without frank hemorrhagic transformation.
Localized edema without significant regional mass effect.
2. No other acute intracranial abnormality.
3. Chronic left MCA and left basal ganglia infarcts.

## 2022-01-29 IMAGING — MR MR HEAD W/O CM
15 of 18 series · 37 of 48 positions shown · IV contrast (gadavist)
Comparison: Head CT from earlier the same day

CLINICAL DATA: Stroke-like symptoms. Left-sided weakness since
[REDACTED] morning

EXAM:
MRI HEAD WITHOUT CONTRAST
MRA HEAD WITHOUT CONTRAST
MRA OF THE NECK WITHOUT AND WITH CONTRAST
TECHNIQUE: Multiplanar, multi-echo pulse sequences of the brain and surrounding
structures were acquired without intravenous contrast. Angiographic
images of the Circle of Willis were acquired using MRA technique
without intravenous contrast. Angiographic images of the neck were
acquired using MRA technique without and with intravenous contrast.
Carotid stenosis measurements (when applicable) are obtained
utilizing NASCET criteria, using the distal internal carotid
diameter as the denominator.
CONTRAST:  7.5mL GADAVIST GADOBUTROL 1 MMOL/ML IV SOLN

[Series 5: DWI · axial · 3.0mm · 0.88mm/px · z∈[-84,+75]mm · 5 of 108 slices shown (1 of 6)]
[im 1/108]
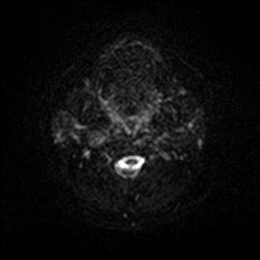
[im 27/108]
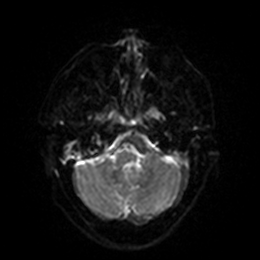
[im 54/108]
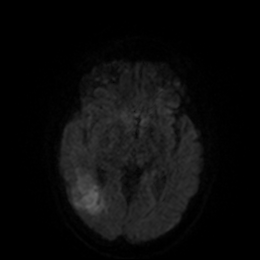
[im 81/108]
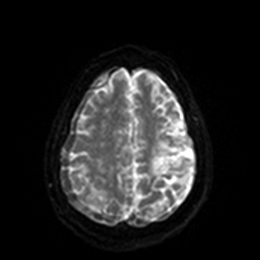
[im 108/108]
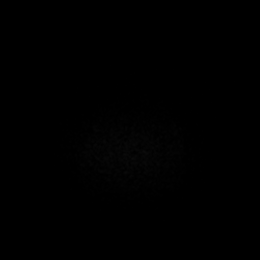

[Series 6: DWI · axial · 3.0mm · 0.88mm/px · z∈[-84,+75]mm · 2 of 54 slices shown (2 of 6)]
[im 1/54]
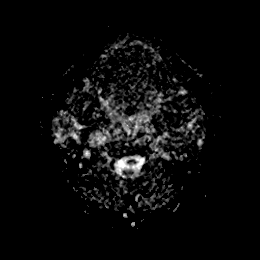
[im 54/54]
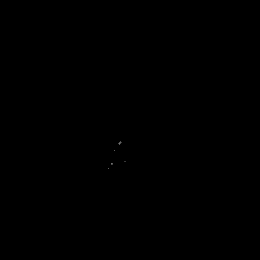

[Series 7: DWI · coronal · 4.0mm · 0.88mm/px · 4 of 80 slices shown (3 of 6)]
[im 1/80]
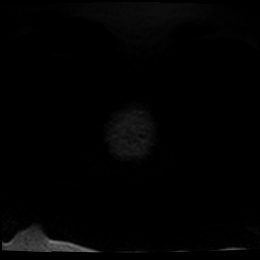
[im 27/80]
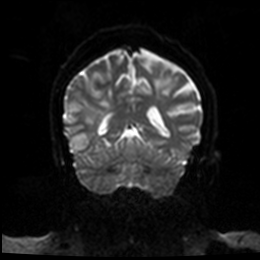
[im 53/80]
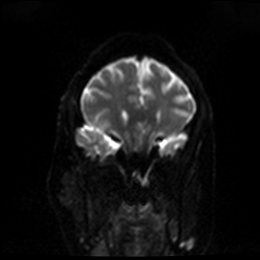
[im 80/80]
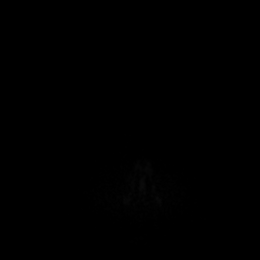

[Series 8: DWI · coronal · 4.0mm · 0.88mm/px · 2 of 40 slices shown (4 of 6)]
[im 1/40]
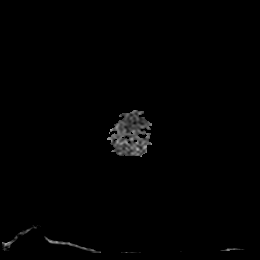
[im 40/40]
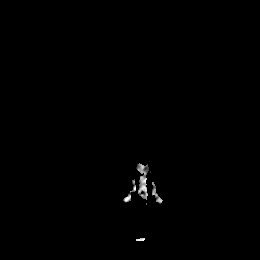

[Series 9: T2 · axial · 5.0mm · 0.72mm/px · 1 of 28 slices shown (1 of 2)]
[im 1/28]
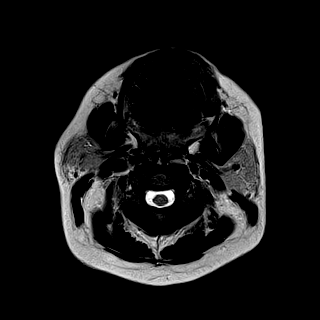

[Series 10: FLAIR · axial · 5.0mm · 0.90mm/px · 1 of 28 slices shown]
[im 1/28]
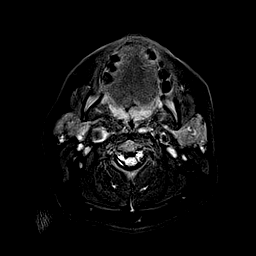

[Series 11: T1 · sagittal · 5.0mm · 0.90mm/px · 1 of 25 slices shown (1 of 2)]
[im 1/25]
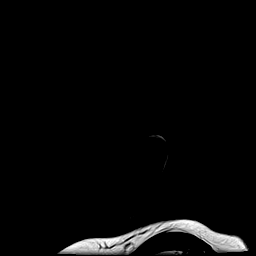

[Series 12: T2 · coronal · 5.0mm · 0.72mm/px · 2 of 33 slices shown (2 of 2)]
[im 1/33]
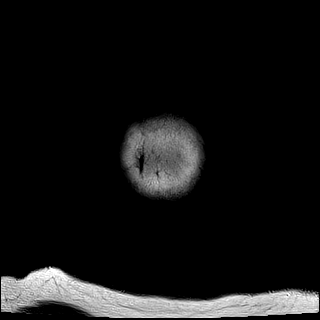
[im 33/33]
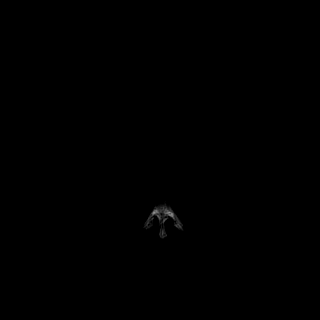

[Series 13: mag_images · axial · 3.0mm · 0.90mm/px · z∈[-87,+78]mm · 3 of 56 slices shown]
[im 1/56]
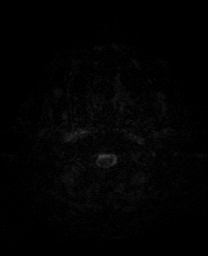
[im 28/56]
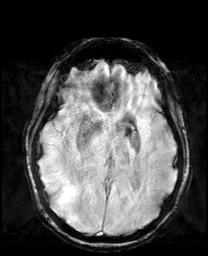
[im 56/56]
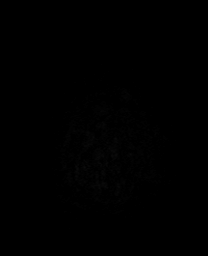

[Series 14: pha_images · axial · 3.0mm · 0.90mm/px · z∈[-84,+78]mm · 3 of 55 slices shown]
[im 1/55]
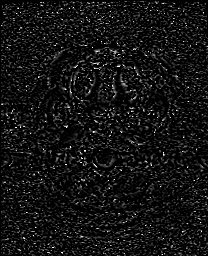
[im 28/55]
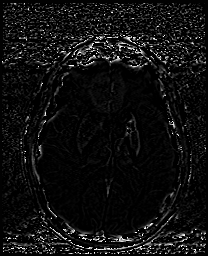
[im 55/55]
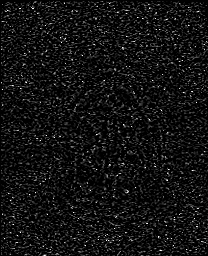

[Series 15: swi_images · axial · 3.0mm · 0.90mm/px · z∈[-87,+78]mm · 3 of 56 slices shown]
[im 1/56]
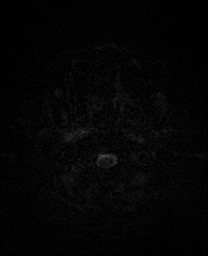
[im 28/56]
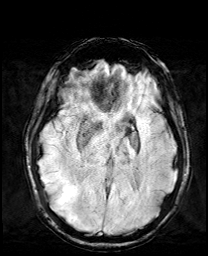
[im 56/56]
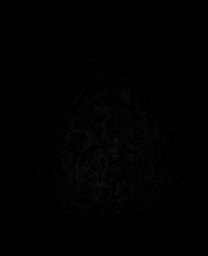

[Series 16: mip_images(sw) · axial · 24.0mm · 0.90mm/px · z∈[-77,+67]mm · 2 of 49 slices shown]
[im 1/49]
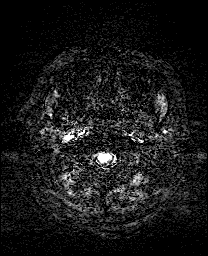
[im 49/49]
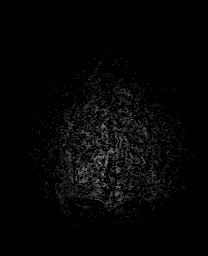

[Series 18: DWI · axial · 3.0mm · 0.88mm/px · z∈[-84,+75]mm · 5 of 108 slices shown (5 of 6)]
[im 1/108]
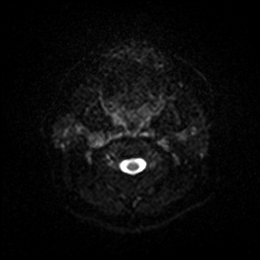
[im 27/108]
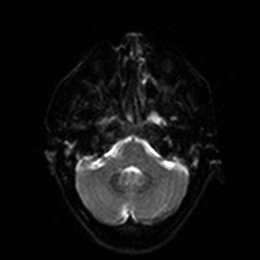
[im 54/108]
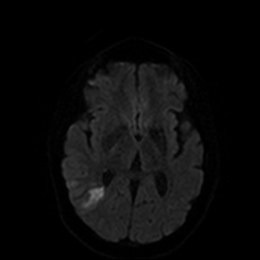
[im 81/108]
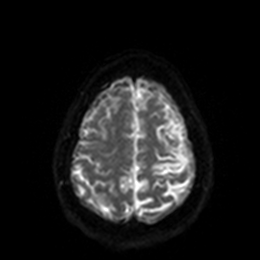
[im 108/108]
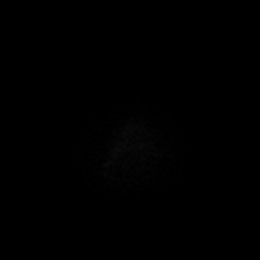

[Series 19: DWI · axial · 3.0mm · 0.88mm/px · z∈[-84,+75]mm · 2 of 54 slices shown (6 of 6)]
[im 1/54]
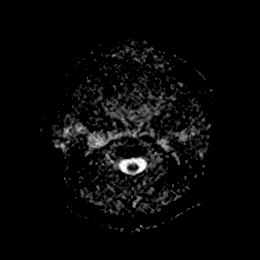
[im 54/54]
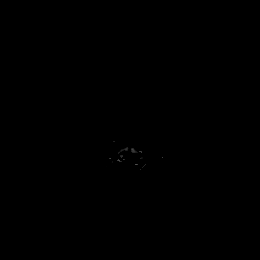

[Series 20: T1 · sagittal · 5.0mm · 0.90mm/px · 1 of 25 slices shown (2 of 2)]
[im 1/25]
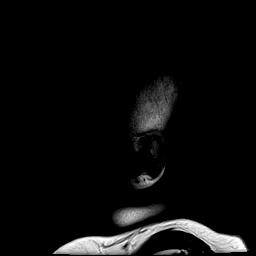

[37 of 48 positions shown; findings below may reference images not displayed]

FINDINGS: MR HEAD FINDINGS

Brain: Extensive primarily cortically based right cerebral
infarction affecting the temporoparietal cortex and extending
superiorly along the superior right frontal cortex. Mild T1
hyperintensity at the infarct compatible with petechial blood
products when correlated with CT. Pre-existing right occipital and
left superior frontal cortex infarcts. Chronic perforator infarct at
the left basal ganglia. No hematoma, hydrocephalus, or herniation.
Pervasive motion artifact. Chronic blood products at the remote
perforator infarct on the left.

Vascular: See below

Skull and upper cervical spine: No focal marrow lesion

Sinuses/Orbits: Negative

Other: Motion artifact.

MRA HEAD FINDINGS

Anterior circulation: Irregular appearance of the carotid siphons
with flow gap at the right supraclinoid segment. No flow seen at the
left M1 or A1 segments with diminutive downstream signal. Reportedly
there was a normal MRA in [Z3] at [HOSPITAL].

Posterior circulation: Patent vertebral and basilar arteries. Fetal
type left PCA flow. Bilateral PCA narrowing, particularly high-grade
at the right P1 and P3 segments.

Anatomic variants:As above.

MRA NECK FINDINGS

Aortic arch: Unremarkable with 3 vessel branching

Right carotid system: Tortuous ICA. No evidence of stenosis or
dissection.

Left carotid system: Tortuous ICA. No visible stenosis or
dissection.

Vertebral arteries: The vertebral arteries are smoothly contoured
and diffusely patent.
IMPRESSION: Brain MRI:

1. Extensive acute infarction in the right temporoparietal cortex
and in the superior right frontal cortex, roughly watershed pattern.
2. Right occipital cortex infarct which is chronic but has occurred
since [DATE] comparison. More remote left superior frontal
infarct.
3. Motion degraded

Intracranial MRA:

1. Degraded by motion.
2. Advanced intracranial stenoses with flow gap at the right
supraclinoid ICA and left carotid terminus/A1/M1 segments. Question
moyamoya disease.

Neck MRA:

Negative motion degraded assessment.

## 2022-01-29 IMAGING — CT CT ANGIO HEAD-NECK (W OR W/O PERF)
2 of 12 series · 6 of 35 positions shown · non-contrast
Comparison: CT head and MRI head [DATE]

CLINICAL DATA: Stroke follow-up.  Left-sided weakness

EXAM:
CT ANGIOGRAPHY HEAD AND NECK
TECHNIQUE: Multidetector CT imaging of the head and neck was performed using
the standard protocol during bolus administration of intravenous
contrast. Multiplanar CT image reconstructions and MIPs were
obtained to evaluate the vascular anatomy. Carotid stenosis
measurements (when applicable) are obtained utilizing NASCET
criteria, using the distal internal carotid diameter as the
denominator.

[Series 11: cta neck axial · axial · 0.46mm/px · z∈[-200,+1]mm · 5 of 304 slices shown]
[im 51/304  soft-tissue]
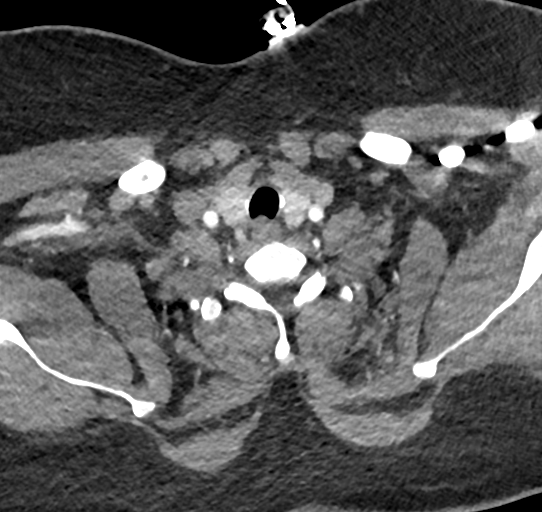
[im 102/304  bone]
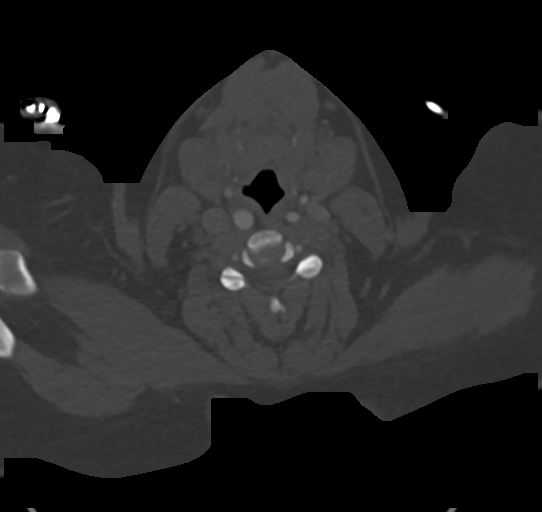
[im 152/304  soft-tissue]
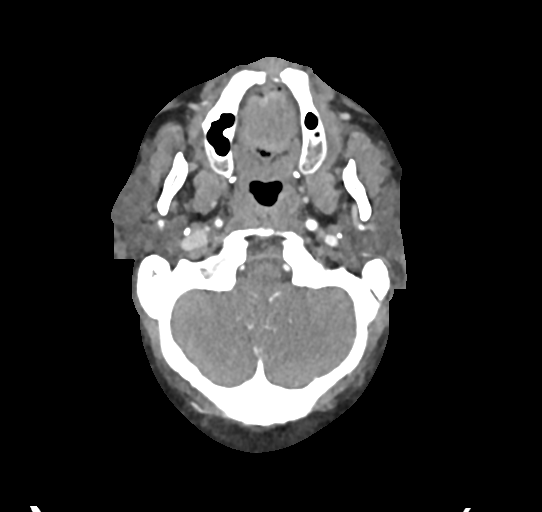
[im 203/304  bone]
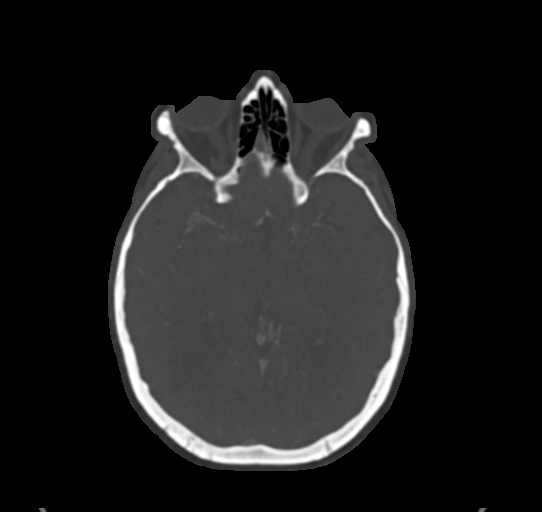
[im 253/304  soft-tissue]
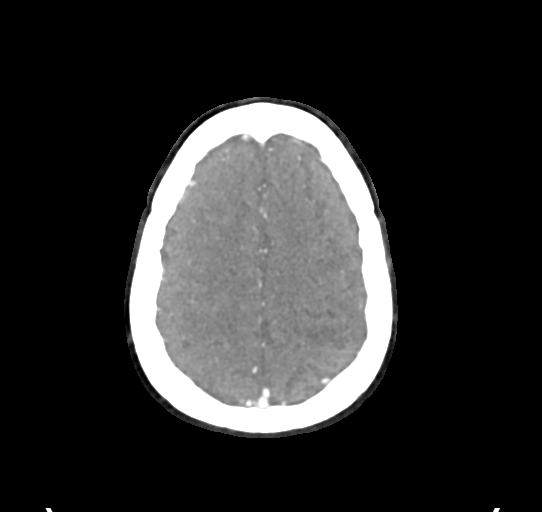

[Series 13: cta neck sagittal · sagittal · 0.46mm/px · 1 of 252 slices shown]
[im 225/252  soft-tissue]
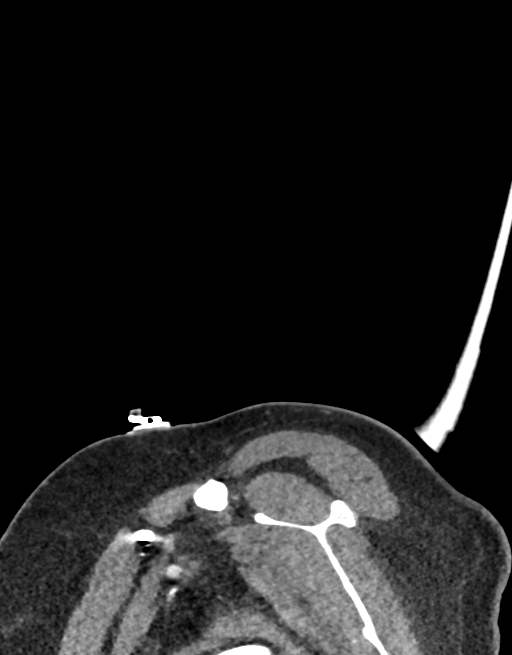

[6 of 35 positions shown; findings below may reference images not displayed]

RADIATION DOSE REDUCTION: This exam was performed according to the
departmental dose-optimization program which includes automated
exposure control, adjustment of the mA and/or kV according to
patient size and/or use of iterative reconstruction technique.

CONTRAST:  100mL OMNIPAQUE IOHEXOL 350 MG/ML SOLN
FINDINGS: CT HEAD FINDINGS

Brain: Hypodensity right parietal lobe unchanged compatible with
acute infarct. No associated hemorrhage.

Chronic infarct right occipital lobe unchanged. Chronic infarct left
basal ganglia unchanged. Chronic infarct left frontal parietal lobe
unchanged.

Ventricle size normal.  No acute hemorrhage.

Vascular: Negative for hyperdense vessel

Skull: Negative

Sinuses: Mild mucosal edema paranasal sinuses.

Orbits: Negative

Review of the MIP images confirms the above findings

CTA NECK FINDINGS

Aortic arch: Standard branching. Imaged portion shows no evidence of
aneurysm or dissection. No significant stenosis of the major arch
vessel origins.

Right carotid system: Right carotid bifurcation normal. Negative for
atherosclerotic disease or dissection. Tortuous right internal
carotid artery

Left carotid system: Left carotid bifurcation normal. No
atherosclerotic disease or dissection. Tortuous left internal
carotid artery.

Vertebral arteries: Normal vertebral arteries bilaterally. No
stenosis or dissection.

Skeleton: Incomplete segmentation of C6 and C7, congenital. No acute
skeletal abnormality.

Other neck: Negative for mass or adenopathy in the neck.

Upper chest: Lung apices clear bilaterally.

Review of the MIP images confirms the above findings

CTA HEAD FINDINGS

Anterior circulation: Irregularity and diffuse narrowing of the
cavernous carotid bilaterally. Scattered small calcifications.
Moderate stenosis right cavernous and supraclinoid internal carotid
artery. Mild stenosis left cavernous and supraclinoid internal
carotid artery.

Anterior cerebral artery patent bilaterally without stenosis. Right
M1 segment patent. Relatively small right middle cerebral artery
without stenosis or large vessel occlusion.

Segmental occlusion left M1 segment. There are collaterals in the
area with decreased opacification of left MCA branches due to
collaterals flow.

Posterior circulation: Both vertebral arteries patent to the
basilar. PICA patent bilaterally. Basilar patent. Fetal origin left
posterior cerebral artery. Both posterior cerebral arteries are
patent. Mild stenosis right PCA. No aneurysm.

Venous sinuses: Normal venous enhancement

Anatomic variants: None

Review of the MIP images confirms the above findings
IMPRESSION: 1. Moderately large acute infarct right parietal lobe unchanged. No
acute hemorrhage.
2. Chronic infarcts in the right occipital lobe, left frontal lobe,
left basal ganglia unchanged. Advanced cerebrovascular disease for
age.
3. Carotid artery and vertebral artery normal in the neck. No
significant atherosclerotic disease in the neck.
4. Irregular stenosis in the cavernous carotid bilaterally, right
greater than left. Several small calcifications are present in the
cavernous carotid bilaterally
5. Segmental occlusion left M1 segment. Decreased flow in the left
MCA branches due to collateral flow.
6. Consider moyamoya disease. Intracranial vasculitis possible.
Intracranial atherosclerotic disease possible however lack of
atherosclerotic disease in the neck and the patient's age reduce the
likelihood of atherosclerotic disease.

## 2022-01-29 IMAGING — MR MR MRA NECK WO/W CM
5 of 11 series · 39 of 48 positions shown · IV contrast (gadavist)
Comparison: Head CT from earlier the same day

CLINICAL DATA: Stroke-like symptoms. Left-sided weakness since
[REDACTED] morning

EXAM:
MRI HEAD WITHOUT CONTRAST
MRA HEAD WITHOUT CONTRAST
MRA OF THE NECK WITHOUT AND WITH CONTRAST
TECHNIQUE: Multiplanar, multi-echo pulse sequences of the brain and surrounding
structures were acquired without intravenous contrast. Angiographic
images of the Circle of Willis were acquired using MRA technique
without intravenous contrast. Angiographic images of the neck were
acquired using MRA technique without and with intravenous contrast.
Carotid stenosis measurements (when applicable) are obtained
utilizing NASCET criteria, using the distal internal carotid
diameter as the denominator.
CONTRAST:  7.5mL GADAVIST GADOBUTROL 1 MMOL/ML IV SOLN

[Series 14: tof_fl3d_tra_iso · axial · 0.6mm · 0.62mm/px · z∈[-164,-85]mm · 11 of 133 slices shown]
[im 1/133]
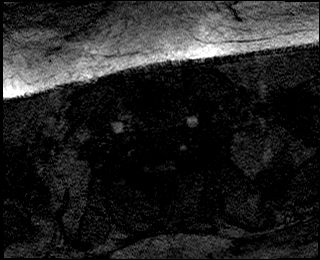
[im 14/133]
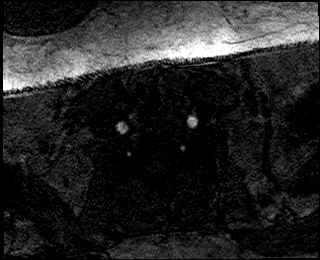
[im 27/133]
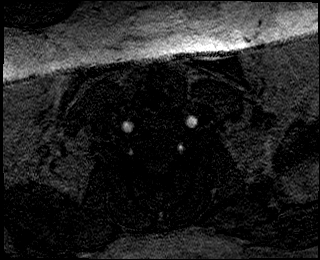
[im 40/133]
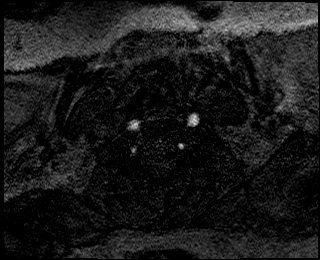
[im 53/133]
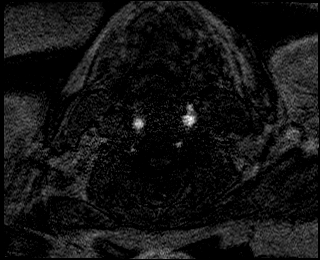
[im 67/133]
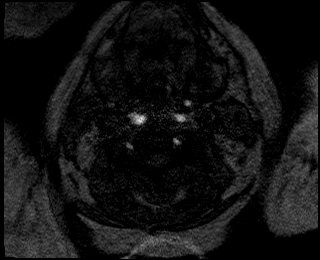
[im 80/133]
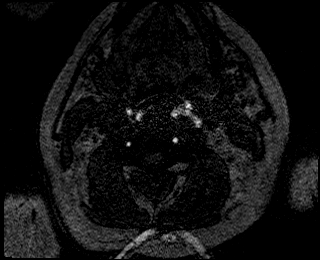
[im 93/133]
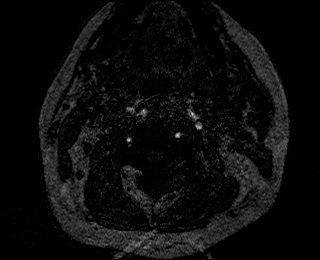
[im 106/133]
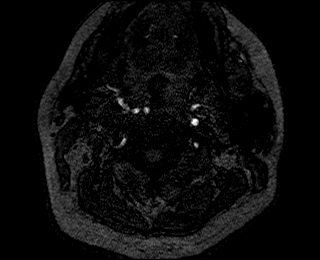
[im 119/133]
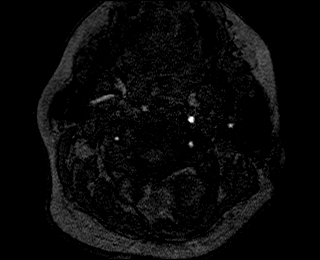
[im 133/133]
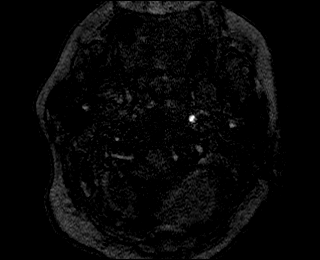

[Series 17: angio_fl3d_cor_pre_ttc=3.0s · coronal · 0.9mm · 0.85mm/px · 7 of 80 slices shown]
[im 1/80]
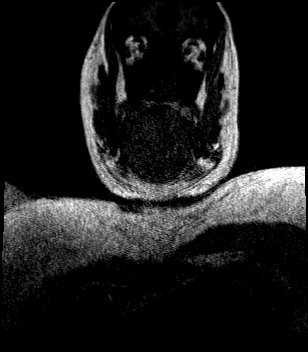
[im 14/80]
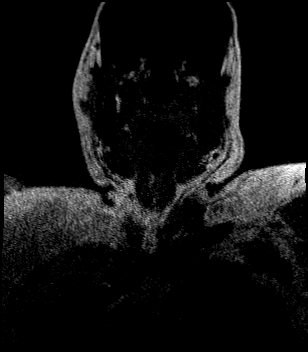
[im 27/80]
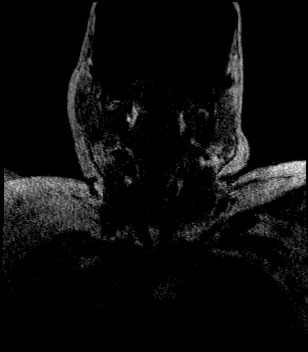
[im 40/80]
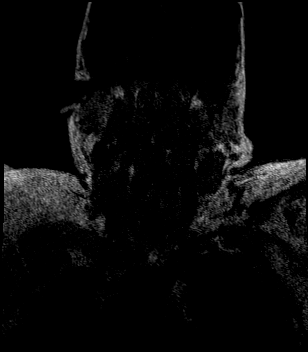
[im 53/80]
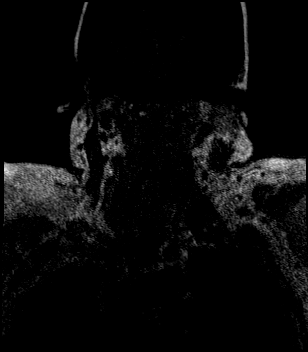
[im 66/80]
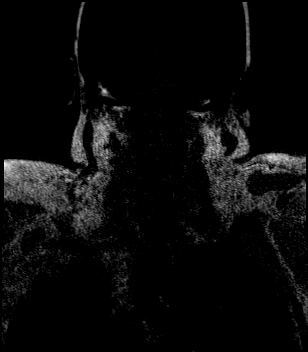
[im 80/80]
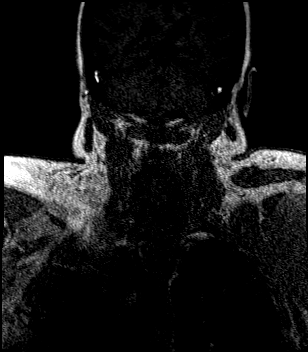

[Series 19: angio_fl3d_cor_post_ttc=3.0s · coronal · 0.9mm · 0.85mm/px · 7 of 80 slices shown]
[im 1/80]
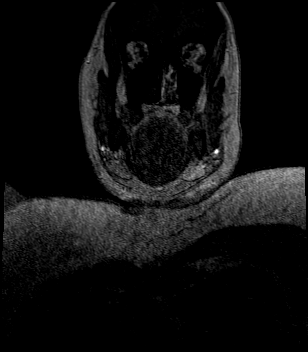
[im 14/80]
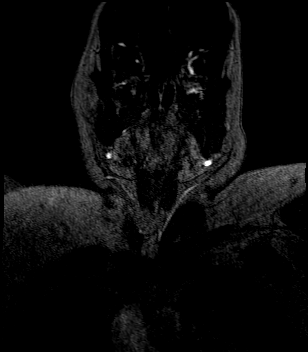
[im 27/80]
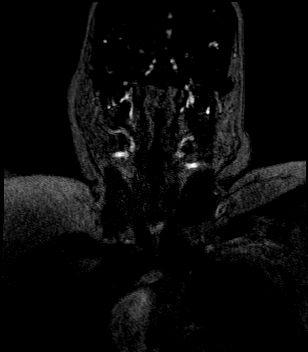
[im 40/80]
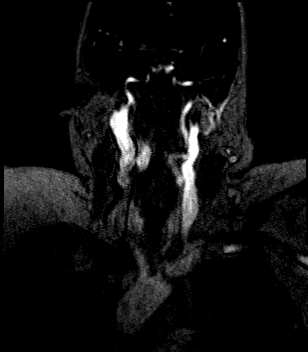
[im 53/80]
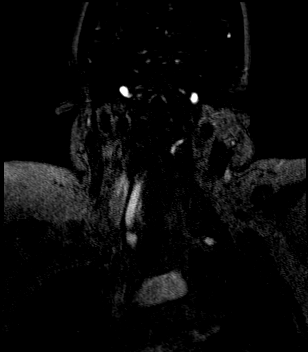
[im 66/80]
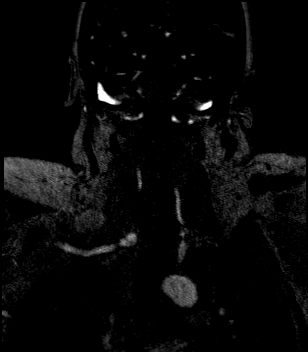
[im 80/80]
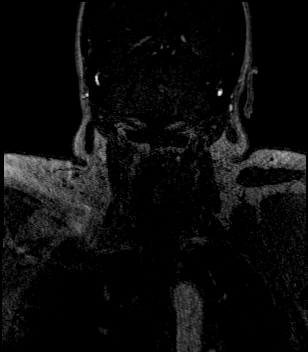

[Series 20: angio_fl3d_cor_post_ttc=3.0s_moco-adv · coronal · 0.9mm · 0.85mm/px · 7 of 80 slices shown]
[im 1/80]
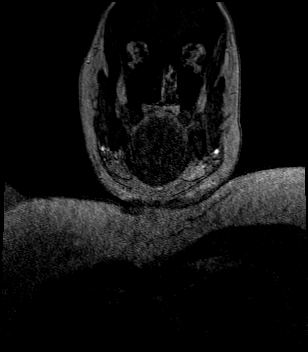
[im 14/80]
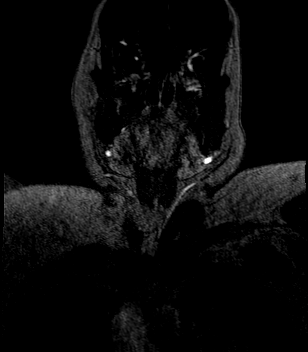
[im 27/80]
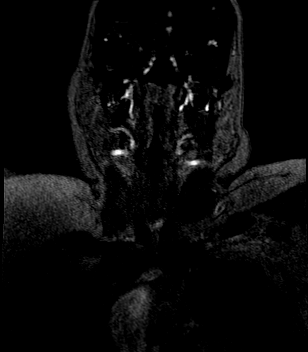
[im 40/80]
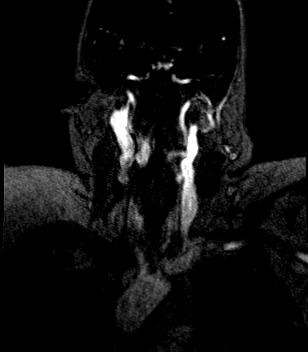
[im 53/80]
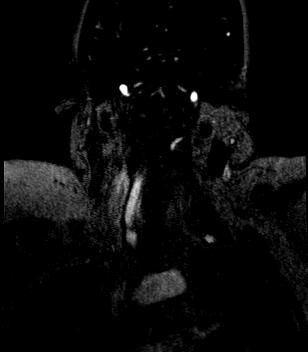
[im 66/80]
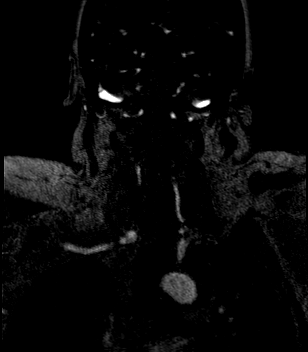
[im 80/80]
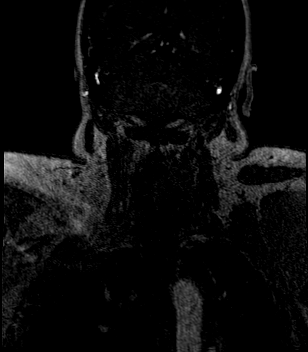

[Series 21: angio_fl3d_cor_post_ttc=3.0s_moco-adv_sub · coronal · 0.9mm · 0.85mm/px · 7 of 78 slices shown]
[im 1/78]
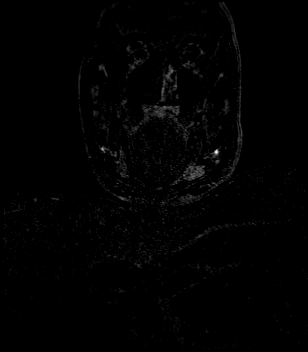
[im 13/78]
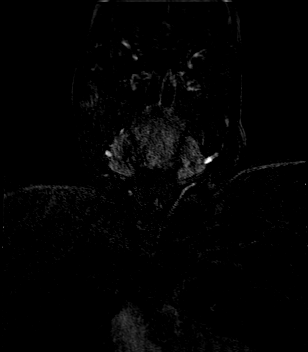
[im 26/78]
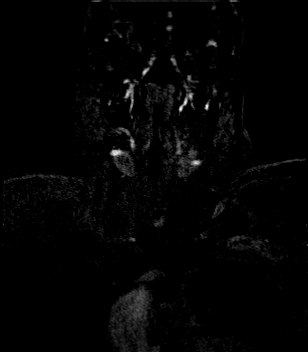
[im 39/78]
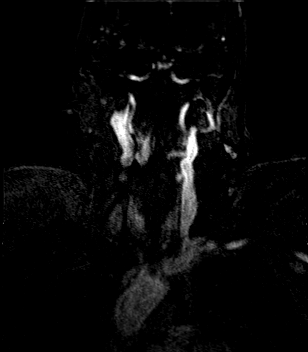
[im 52/78]
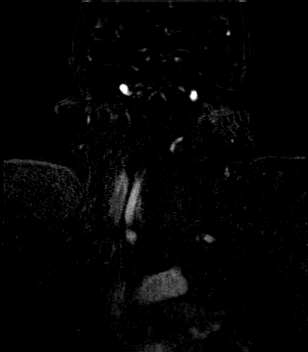
[im 65/78]
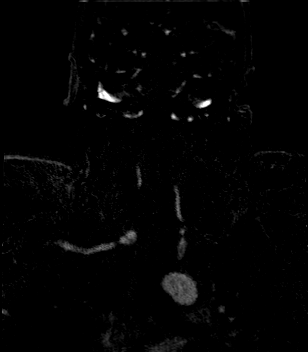
[im 78/78]
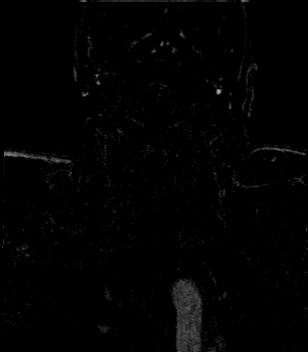

[39 of 48 positions shown; findings below may reference images not displayed]

FINDINGS: MR HEAD FINDINGS

Brain: Extensive primarily cortically based right cerebral
infarction affecting the temporoparietal cortex and extending
superiorly along the superior right frontal cortex. Mild T1
hyperintensity at the infarct compatible with petechial blood
products when correlated with CT. Pre-existing right occipital and
left superior frontal cortex infarcts. Chronic perforator infarct at
the left basal ganglia. No hematoma, hydrocephalus, or herniation.
Pervasive motion artifact. Chronic blood products at the remote
perforator infarct on the left.

Vascular: See below

Skull and upper cervical spine: No focal marrow lesion

Sinuses/Orbits: Negative

Other: Motion artifact.

MRA HEAD FINDINGS

Anterior circulation: Irregular appearance of the carotid siphons
with flow gap at the right supraclinoid segment. No flow seen at the
left M1 or A1 segments with diminutive downstream signal. Reportedly
there was a normal MRA in [Z3] at [HOSPITAL].

Posterior circulation: Patent vertebral and basilar arteries. Fetal
type left PCA flow. Bilateral PCA narrowing, particularly high-grade
at the right P1 and P3 segments.

Anatomic variants:As above.

MRA NECK FINDINGS

Aortic arch: Unremarkable with 3 vessel branching

Right carotid system: Tortuous ICA. No evidence of stenosis or
dissection.

Left carotid system: Tortuous ICA. No visible stenosis or
dissection.

Vertebral arteries: The vertebral arteries are smoothly contoured
and diffusely patent.
IMPRESSION: Brain MRI:

1. Extensive acute infarction in the right temporoparietal cortex
and in the superior right frontal cortex, roughly watershed pattern.
2. Right occipital cortex infarct which is chronic but has occurred
since [DATE] comparison. More remote left superior frontal
infarct.
3. Motion degraded

Intracranial MRA:

1. Degraded by motion.
2. Advanced intracranial stenoses with flow gap at the right
supraclinoid ICA and left carotid terminus/A1/M1 segments. Question
moyamoya disease.

Neck MRA:

Negative motion degraded assessment.

## 2022-01-29 MED ORDER — LABETALOL HCL 5 MG/ML IV SOLN
10.0000 mg | INTRAVENOUS | Status: DC | PRN
Start: 2022-01-29 — End: 2022-01-29

## 2022-01-29 MED ORDER — IOHEXOL 350 MG/ML SOLN
100.0000 mL | Freq: Once | INTRAVENOUS | Status: AC | PRN
Start: 2022-01-29 — End: 2022-01-29
  Administered 2022-01-29: 09:00:00 100 mL via INTRAVENOUS

## 2022-01-29 MED ORDER — ASPIRIN 300 MG RE SUPP: 300.0000 mg | Freq: Once | RECTAL | Status: DC

## 2022-01-29 MED ORDER — HYDRALAZINE HCL 20 MG/ML IJ SOLN
10.0000 mg | INTRAMUSCULAR | Status: DC | PRN
  Administered 2022-01-29 – 2022-01-30 (×3): 10 mg via INTRAVENOUS
  Filled 2022-01-29 (×3): qty 1

## 2022-01-29 MED ORDER — ASPIRIN EC 81 MG PO TBEC
81.0000 mg | DELAYED_RELEASE_TABLET | Freq: Every day | ORAL | Status: DC
  Administered 2022-01-29 – 2022-02-05 (×8): 81 mg via ORAL
  Filled 2022-01-29 (×8): qty 1

## 2022-01-29 MED ORDER — ACETAMINOPHEN 325 MG PO TABS: 650.0000 mg | ORAL_TABLET | Freq: Four times a day (QID) | ORAL | Status: DC | PRN

## 2022-01-29 MED ORDER — CLOPIDOGREL BISULFATE 75 MG PO TABS
75.0000 mg | ORAL_TABLET | Freq: Every day | ORAL | Status: DC
  Administered 2022-01-29 – 2022-02-05 (×8): 75 mg via ORAL
  Filled 2022-01-29 (×8): qty 1

## 2022-01-29 MED ORDER — ACETAMINOPHEN 650 MG RE SUPP: 650.0000 mg | Freq: Four times a day (QID) | RECTAL | Status: DC | PRN

## 2022-01-29 MED ORDER — GADOBUTROL 1 MMOL/ML IV SOLN
7.5000 mL | Freq: Once | INTRAVENOUS | Status: AC | PRN
  Administered 2022-01-29: 07:00:00 7.5 mL via INTRAVENOUS

## 2022-01-29 MED ORDER — ATORVASTATIN CALCIUM 40 MG PO TABS
40.0000 mg | ORAL_TABLET | Freq: Every day | ORAL | Status: DC
  Administered 2022-01-29 – 2022-02-05 (×8): 40 mg via ORAL
  Filled 2022-01-29 (×8): qty 1

## 2022-01-29 MED ORDER — LABETALOL HCL 5 MG/ML IV SOLN
10.0000 mg | INTRAVENOUS | Status: DC | PRN
Start: 2022-01-29 — End: 2022-02-05
  Administered 2022-01-29 – 2022-01-30 (×3): 10 mg via INTRAVENOUS
  Filled 2022-01-29 (×3): qty 4

## 2022-01-29 MED ORDER — ENOXAPARIN SODIUM 40 MG/0.4ML IJ SOSY
40.0000 mg | PREFILLED_SYRINGE | INTRAMUSCULAR | Status: DC
  Administered 2022-01-29 – 2022-02-04 (×7): 40 mg via SUBCUTANEOUS
  Filled 2022-01-29 (×7): qty 0.4

## 2022-01-29 MED ORDER — HYDRALAZINE HCL 20 MG/ML IJ SOLN: 10.0000 mg | INTRAMUSCULAR | Status: DC | PRN

## 2022-01-29 MED ORDER — STROKE: EARLY STAGES OF RECOVERY BOOK: Freq: Once | Status: DC

## 2022-01-29 NOTE — H&P (View-Only) (Signed)
STROKE TEAM PROGRESS NOTE   INTERVAL HISTORY Patient is seen in her room with no family at the bedside. She states that yesterday after awakening, her left arm and leg were weak.  She called 911 and presented to the ED.  MRI shows infarction in the right temporparietal cortex and superior right frontal cortex.  Patient has a history of uncontrolled HTN and states that she does not have a PCP at this time.  She also has a history of cocaine and methamphetamine use and would like assistance in substance use cessation.  Vitals:   01/29/22 1000 01/29/22 1030 01/29/22 1100 01/29/22 1130  BP: (!) 219/140 (!) 224/137 (!) 210/113 (!) 237/144  Pulse: 82 87 83 80  Resp: 20 (!) 21 20 (!) 21  Temp:      TempSrc:      SpO2: 98% 100% 100% 99%   CBC:  Recent Labs  Lab 01/28/22 2350 01/29/22 0915  WBC 9.1 7.8  NEUTROABS 5.6  --   HGB 13.2 13.3  HCT 40.6 40.7  MCV 84.6 85.3  PLT 218 229   Basic Metabolic Panel:  Recent Labs  Lab 01/28/22 2350 01/29/22 0915  NA 137 135  K 3.4* 3.7  CL 104 103  CO2 23 24  GLUCOSE 112* 149*  BUN 9 9  CREATININE 0.90 0.86  CALCIUM 8.8* 8.7*   Lipid Panel:  Recent Labs  Lab 01/29/22 0437  CHOL 155  TRIG 160*  HDL 32*  CHOLHDL 4.8  VLDL 32  LDLCALC 91   HgbA1c:  Recent Labs  Lab 01/29/22 0437  HGBA1C 4.7*   Urine Drug Screen:  Recent Labs  Lab 01/29/22 0204  LABOPIA NONE DETECTED  COCAINSCRNUR NONE DETECTED  LABBENZ NONE DETECTED  AMPHETMU POSITIVE*  THCU NONE DETECTED  LABBARB NONE DETECTED    Alcohol Level  Recent Labs  Lab 01/28/22 2350  ETH <10    IMAGING past 24 hours CT ANGIO HEAD NECK W WO CM  Result Date: 01/29/2022 CLINICAL DATA:  Stroke follow-up.  Left-sided weakness EXAM: CT ANGIOGRAPHY HEAD AND NECK TECHNIQUE: Multidetector CT imaging of the head and neck was performed using the standard protocol during bolus administration of intravenous contrast. Multiplanar CT image reconstructions and MIPs were obtained to  evaluate the vascular anatomy. Carotid stenosis measurements (when applicable) are obtained utilizing NASCET criteria, using the distal internal carotid diameter as the denominator. RADIATION DOSE REDUCTION: This exam was performed according to the departmental dose-optimization program which includes automated exposure control, adjustment of the mA and/or kV according to patient size and/or use of iterative reconstruction technique. CONTRAST:  100mL OMNIPAQUE IOHEXOL 350 MG/ML SOLN COMPARISON:  CT head and MRI head 01/29/2022 FINDINGS: CT HEAD FINDINGS Brain: Hypodensity right parietal lobe unchanged compatible with acute infarct. No associated hemorrhage. Chronic infarct right occipital lobe unchanged. Chronic infarct left basal ganglia unchanged. Chronic infarct left frontal parietal lobe unchanged. Ventricle size normal.  No acute hemorrhage. Vascular: Negative for hyperdense vessel Skull: Negative Sinuses: Mild mucosal edema paranasal sinuses. Orbits: Negative Review of the MIP images confirms the above findings CTA NECK FINDINGS Aortic arch: Standard branching. Imaged portion shows no evidence of aneurysm or dissection. No significant stenosis of the major arch vessel origins. Right carotid system: Right carotid bifurcation normal. Negative for atherosclerotic disease or dissection. Tortuous right internal carotid artery Left carotid system: Left carotid bifurcation normal. No atherosclerotic disease or dissection. Tortuous left internal carotid artery. Vertebral arteries: Normal vertebral arteries bilaterally. No stenosis or dissection. Skeleton: Incomplete  segmentation of C6 and C7, congenital. No acute skeletal abnormality. Other neck: Negative for mass or adenopathy in the neck. Upper chest: Lung apices clear bilaterally. Review of the MIP images confirms the above findings CTA HEAD FINDINGS Anterior circulation: Irregularity and diffuse narrowing of the cavernous carotid bilaterally. Scattered small  calcifications. Moderate stenosis right cavernous and supraclinoid internal carotid artery. Mild stenosis left cavernous and supraclinoid internal carotid artery. Anterior cerebral artery patent bilaterally without stenosis. Right M1 segment patent. Relatively small right middle cerebral artery without stenosis or large vessel occlusion. Segmental occlusion left M1 segment. There are collaterals in the area with decreased opacification of left MCA branches due to collaterals flow. Posterior circulation: Both vertebral arteries patent to the basilar. PICA patent bilaterally. Basilar patent. Fetal origin left posterior cerebral artery. Both posterior cerebral arteries are patent. Mild stenosis right PCA. No aneurysm. Venous sinuses: Normal venous enhancement Anatomic variants: None Review of the MIP images confirms the above findings IMPRESSION: 1. Moderately large acute infarct right parietal lobe unchanged. No acute hemorrhage. 2. Chronic infarcts in the right occipital lobe, left frontal lobe, left basal ganglia unchanged. Advanced cerebrovascular disease for age. 3. Carotid artery and vertebral artery normal in the neck. No significant atherosclerotic disease in the neck. 4. Irregular stenosis in the cavernous carotid bilaterally, right greater than left. Several small calcifications are present in the cavernous carotid bilaterally 5. Segmental occlusion left M1 segment. Decreased flow in the left MCA branches due to collateral flow. 6. 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 Intracranial vasculitis possible. Intracranial atherosclerotic disease possible however lack of atherosclerotic disease in the neck and the patient's age reduce the likelihood of atherosclerotic disease. Electronically Signed   By: Marlan Palau M.D.   On: 01/29/2022 09:16   CT HEAD WO CONTRAST ( )  Result Date: 01/29/2022 CLINICAL DATA:  Initial evaluation for left-sided weakness for 2 days. EXAM: CT HEAD WITHOUT CONTRAST TECHNIQUE: Contiguous  axial images were obtained from the base of the skull through the vertex without intravenous contrast. RADIATION DOSE REDUCTION: This exam was performed according to the departmental dose-optimization program which includes automated exposure control, adjustment of the mA and/or kV according to patient size and/or use of iterative reconstruction technique. COMPARISON:  Prior head CT from 09/10/2019. FINDINGS: Brain: Cerebral volume within normal limits for age. Chronic encephalomalacia involving the anterior left frontal lobe consistent with a chronic left MCA distribution infarct. Additional chronic left basal ganglia infarct noted. Evolving cytotoxic edema present within the right parieto-occipital region, consistent with an acute to early subacute right MCA distribution and/or MCA/PCA watershed distribution infarct. Suspected faint petechial blood products without frank hemorrhagic transformation. Localized edema without significant regional mass effect. No other acute large vessel territory infarct or intracranial hemorrhage. No mass lesion or significant midline shift. No hydrocephalus or extra-axial fluid collection. Vascular: No hyperdense vessel. Skull: Scalp soft tissues demonstrate no acute finding. Calvarium intact. Sinuses/Orbits: Globes and orbital soft tissues demonstrate no acute finding. Small right sphenoid sinus retention cyst noted. Paranasal sinuses are otherwise clear. Moderate right with small left mastoid effusions. Other: None. IMPRESSION: 1. Evolving cytotoxic edema within the right parieto-occipital region, consistent with an acute to early subacute right MCA and/or MCA/PCA watershed distribution infarct. Probable associated faint petechial blood products without frank hemorrhagic transformation. Localized edema without significant regional mass effect. 2. No other acute intracranial abnormality. 3. 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 Electronically Signed   By: Rise Mu M.D.   On: 01/29/2022 00:46   MR ANGIO HEAD  WO CONTRAST  Result Date: 01/29/2022 CLINICAL DATA:  Stroke-like symptoms. Left-sided weakness since Monday morning EXAM: MRI HEAD WITHOUT CONTRAST MRA HEAD WITHOUT CONTRAST MRA OF THE NECK WITHOUT AND WITH CONTRAST TECHNIQUE: Multiplanar, multi-echo pulse sequences of the brain and surrounding structures were acquired without intravenous contrast. Angiographic images of the Circle of Willis were acquired using MRA technique without intravenous contrast. Angiographic images of the neck were acquired using MRA technique without and with intravenous contrast. Carotid stenosis measurements (when applicable) are obtained utilizing NASCET criteria, using the distal internal carotid diameter as the denominator. CONTRAST:  7.32mL GADAVIST GADOBUTROL 1 MMOL/ML IV SOLN COMPARISON:  Head CT from earlier the same day FINDINGS: MR HEAD FINDINGS Brain: Extensive primarily cortically based right cerebral infarction affecting the temporoparietal cortex and extending superiorly along the superior right frontal cortex. Mild T1 hyperintensity at the infarct compatible with petechial blood products when correlated with CT. Pre-existing right occipital and left superior frontal cortex infarcts. Chronic perforator infarct at the left basal ganglia. No hematoma, hydrocephalus, or herniation. Pervasive motion artifact. Chronic blood products at the remote perforator infarct on the left. Vascular: See below Skull and upper cervical spine: No focal marrow lesion Sinuses/Orbits: Negative Other: Motion artifact. MRA HEAD FINDINGS Anterior circulation: Irregular appearance of the carotid siphons with flow gap at the right supraclinoid segment. No flow seen at the left M1 or A1 segments with diminutive downstream signal. Reportedly there was a normal MRA in 2019 at [facility]. Posterior circulation: Patent vertebral and basilar arteries. Fetal type left PCA flow. Bilateral PCA narrowing,  particularly high-grade at the right P1 and P3 segments. Anatomic variants:As above. MRA NECK FINDINGS Aortic arch: Unremarkable with 3 vessel branching Right carotid system: Tortuous ICA. No evidence of stenosis or dissection. Left carotid system: Tortuous ICA. No visible stenosis or dissection. Vertebral arteries: The vertebral arteries are smoothly contoured and diffusely patent. IMPRESSION: Brain MRI: 1. Extensive acute infarction in the right temporoparietal cortex and in the superior right frontal cortex, roughly watershed pattern. 2. Right occipital cortex infarct which is chronic but has occurred since May 2022 comparison. More remote left superior frontal infarct. 3. Motion degraded Intracranial MRA: 1. Degraded by motion. 2. Advanced intracranial stenoses with flow gap at the right supraclinoid ICA and left carotid terminus/A1/M1 segments. Question moyamoya disease. Neck MRA: Negative motion degraded assessment. Electronically Signed   By: Tiburcio Pea M.D.   On: 01/29/2022 07:12   MR ANGIO NECK W WO CONTRAST  Result Date: 01/29/2022 CLINICAL DATA:  Stroke-like symptoms. Left-sided weakness since Monday morning EXAM: MRI HEAD WITHOUT CONTRAST MRA HEAD WITHOUT CONTRAST MRA OF THE NECK WITHOUT AND WITH CONTRAST TECHNIQUE: Multiplanar, multi-echo pulse sequences of the brain and surrounding structures were acquired without intravenous contrast. Angiographic images of the Circle of Willis were acquired using MRA technique without intravenous contrast. Angiographic images of the neck were acquired using MRA technique without and with intravenous contrast. Carotid stenosis measurements (when applicable) are obtained utilizing NASCET criteria, using the distal internal carotid diameter as the denominator. CONTRAST:  7.16mL GADAVIST GADOBUTROL 1 MMOL/ML IV SOLN COMPARISON:  Head CT from earlier the same day FINDINGS: MR HEAD FINDINGS Brain: Extensive primarily cortically based right cerebral infarction  affecting the temporoparietal cortex and extending superiorly along the superior right frontal cortex. Mild T1 hyperintensity at the infarct compatible with petechial blood products when correlated with CT. Pre-existing right occipital and left superior frontal cortex infarcts. Chronic perforator infarct at the left basal ganglia. No hematoma, hydrocephalus, or herniation. Pervasive  motion artifact. Chronic blood products at the remote perforator infarct on the left. Vascular: See below Skull and upper cervical spine: No focal marrow lesion Sinuses/Orbits: Negative Other: Motion artifact. MRA HEAD FINDINGS Anterior circulation: Irregular appearance of the carotid siphons with flow gap at the right supraclinoid segment. No flow seen at the left M1 or A1 segments with diminutive downstream signal. Reportedly there was a normal MRA in 2019 at [facility]. Posterior circulation: Patent vertebral and basilar arteries. Fetal type left PCA flow. Bilateral PCA narrowing, particularly high-grade at the right P1 and P3 segments. Anatomic variants:As above. MRA NECK FINDINGS Aortic arch: Unremarkable with 3 vessel branching Right carotid system: Tortuous ICA. No evidence of stenosis or dissection. Left carotid system: Tortuous ICA. No visible stenosis or dissection. Vertebral arteries: The vertebral arteries are smoothly contoured and diffusely patent. IMPRESSION: Brain MRI: 1. Extensive acute infarction in the right temporoparietal cortex and in the superior right frontal cortex, roughly watershed pattern. 2. Right occipital cortex infarct which is chronic but has occurred since May 2022 comparison. More remote left superior frontal infarct. 3. Motion degraded Intracranial MRA: 1. Degraded by motion. 2. Advanced intracranial stenoses with flow gap at the right supraclinoid ICA and left carotid terminus/A1/M1 segments. Question moyamoya disease. Neck MRA: Negative motion degraded assessment. Electronically Signed   By: Tiburcio Pea  M.D.   On: 01/29/2022 07:12   MR BRAIN WO CONTRAST  Result Date: 01/29/2022 CLINICAL DATA:  Stroke-like symptoms. Left-sided weakness since Monday morning EXAM: MRI HEAD WITHOUT CONTRAST MRA HEAD WITHOUT CONTRAST MRA OF THE NECK WITHOUT AND WITH CONTRAST TECHNIQUE: Multiplanar, multi-echo pulse sequences of the brain and surrounding structures were acquired without intravenous contrast. Angiographic images of the Circle of Willis were acquired using MRA technique without intravenous contrast. Angiographic images of the neck were acquired using MRA technique without and with intravenous contrast. Carotid stenosis measurements (when applicable) are obtained utilizing NASCET criteria, using the distal internal carotid diameter as the denominator. CONTRAST:  7.37mL GADAVIST GADOBUTROL 1 MMOL/ML IV SOLN COMPARISON:  Head CT from earlier the same day FINDINGS: MR HEAD FINDINGS Brain: Extensive primarily cortically based right cerebral infarction affecting the temporoparietal cortex and extending superiorly along the superior right frontal cortex. Mild T1 hyperintensity at the infarct compatible with petechial blood products when correlated with CT. Pre-existing right occipital and left superior frontal cortex infarcts. Chronic perforator infarct at the left basal ganglia. No hematoma, hydrocephalus, or herniation. Pervasive motion artifact. Chronic blood products at the remote perforator infarct on the left. Vascular: See below Skull and upper cervical spine: No focal marrow lesion Sinuses/Orbits: Negative Other: Motion artifact. MRA HEAD FINDINGS Anterior circulation: Irregular appearance of the carotid siphons with flow gap at the right supraclinoid segment. No flow seen at the left M1 or A1 segments with diminutive downstream signal. Reportedly there was a normal MRA in 2019 at [facility]. Posterior circulation: Patent vertebral and basilar arteries. Fetal type left PCA flow. Bilateral PCA narrowing, particularly high-grade  at the right P1 and P3 segments. Anatomic variants:As above. MRA NECK FINDINGS Aortic arch: Unremarkable with 3 vessel branching Right carotid system: Tortuous ICA. No evidence of stenosis or dissection. Left carotid system: Tortuous ICA. No visible stenosis or dissection. Vertebral arteries: The vertebral arteries are smoothly contoured and diffusely patent. IMPRESSION: Brain MRI: 1. Extensive acute infarction in the right temporoparietal cortex and in the superior right frontal cortex, roughly watershed pattern. 2. Right occipital cortex infarct which is chronic but has occurred since May 2022 comparison. More remote  left superior frontal infarct. 3. Motion degraded Intracranial MRA: 1. Degraded by motion. 2. Advanced intracranial stenoses with flow gap at the right supraclinoid ICA and left carotid terminus/A1/M1 segments. Question moyamoya disease. Neck MRA: Negative motion degraded assessment. Electronically Signed   By: Tiburcio Pea M.D.   On: 01/29/2022 07:12    PHYSICAL EXAM General:  Alert, obese patient, tearful   NEURO:  Mental Status: AA&Ox3  Speech/Language: speech is without dysarthria or aphasia.  Naming, repetition, fluency, and comprehension intact.  Cranial Nerves:  II: PERRL. Visual fields full.  III, IV, VI: EOMI. Eyelids elevate symmetrically.  V: Sensation is intact to light touch and symmetrical to face.  VII: Smile is symmetrical. . IX, X: Phonation is normal.  XII: tongue is midline without fasciculations. Motor: 5/5 strength to RUE, RLE and LLE. 4/5 strength to LUE  Tone: is normal and bulk is normal Sensation- Intact to light touch bilaterally.  Coordination: FTN with mild ataxia bilaterally, HKS: no ataxia in BLE. Drift in LUE  Gait- deferred   ASSESSMENT/PLAN Ms. Krystal Sherman is a 41 y.o. female with history of HTN and substance abuse presenting with left arm and leg weakness which occurred yesterday after awakening.  She called 911 and presented to the ED.   MRI shows infarction in the right temporparietal cortex and superior right frontal cortex.  Patient has a history of uncontrolled HTN and states that she does not have a PCP at this time.  She also has a history of cocaine and methamphetamine use.  Discussed need for better control of hypertension and cessation of substance abuse with patient.  She states that she is willing to establish care with a PCP, take medications for her hypertension and that she would like assistance with substance use cessation.  Stroke:  right MCA scattered infarct due to right ICA siphon stenosis likely secondary large vessel source in the setting of uncontrolled hypertension and stimulant abuse CT head Evolving cytotoxic edema in right parieto-occipital region consistent with acute right MCA or MCA/PCA infarct, chronic left MCA and basal ganglia infarcts CTA head & neck irregular stenosis in bilateral cavernous carotids right more than left, segmental occlusion of left M1 segment, consider moyamoya disease MRI  extensive infarct in right temporparietal cortex and superior right frontal cortex MRA  advanced intracranial stenosis with flow gap at right supraclinoid ICA and left carotid terminus 2D Echo EF 55-60% TEE pending If PFO present, may need LE venous Doppler to rule out DVT. LDL 91 HgbA1c 4.7 UDS positive for amphetamine  Hypercoagulable and autoimmune work up pending VTE prophylaxis - lovenox No antithrombotic prior to admission, now on aspirin 81 mg daily and clopidogrel 75 mg daily.  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 On DAPT and statin  Hx  of stroke 04/2018 presented to ED for right sided weakness and numbness for 2 weeks as well as aphasia. MRi showed left frontal infarcts. MRA head and neck unremarkable. CT negative. Discharged with ASA and lipitor 80.  Pt did not follow up with physician and not compliant with meds  Hypertension, uncontrolled Home meds:  none Unstable, requiring IV hydralazine and labetalol Permissive hypertension (OK if < 220/120) but gradually  normalize in 3-5 days Long-term BP goal normotensive  Hyperlipidemia Home meds:  none LDL 91, goal < 70 Add atorvastatin 40 mg  Continue statin at discharge  Polysubstance abuse Patient has history of methamphetamine and cocaine use UDS positive for amphetamine Patient is willing to quit TOC consult placed for cessation assistance  Tobacco abuse Current smoker Smoking cessation counseling will be provided  Other Stroke Risk Factors Obesity, BMI >/= 30 associated with increased stroke risk, recommend weight loss, diet and exercise as appropriate  Family hx stroke   Other Active Problems   Hospital day # 0  Cortney E Ernestina Columbia , MSN, AGACNP-BC [facility] See Amion for schedule and pager information 01/29/2022 12:23 PM  ATTENDING NOTE: I reviewed above note and agree with the assessment and plan. Pt was seen and examined.   41 year old female with history of substance abuse, uncontrolled HTN, stroke, smoker admitted for left sided weakness, left blurry vision, and HA. BP 223/124. CT showed right parietooccipital acute infarcts, however, old left frontal, left BG, and right PCA old infarcts. MRI showed right MCA scattered infarcts. EF 55-60%. TEE pending. CTA irregular stenosis in bilateral cavernous carotids, right more than left, segmental occlusion of left M1 segment, consider moyamoya disease. LDL 91, A1C 4.7. UDS positive for amphetamine.  Hypercoagulable and autoimmune work-up pending.  Patient had a stroke in 04/2018 presented to ED  for right sided weakness and numbness for 2 weeks as well as aphasia. MRi showed left frontal infarcts. MRA head and neck unremarkable. CT negative. Discharged with ASA and lipitor 80. Pt did not follow up with physician and not compliant with meds  On exam, no family at the bedside, pt drowsy sleepy but able to answer questions with stimulation and following commands. orientated to age, place, time. No aphasia but mild dysarthria, paucity of speech, able to name and repeat. No gaze palsy, tracking bilaterally, left hemianopia. Mild left facial droop. Tongue midline. RUE and RLE 5/5, no drift. However, LUE and LLE 4/5 with mild drift. Sensation symmetrical bilaterally, right FTN intact, left FTN ataxic but not out of proportion to weakness, gait not tested.   Etiology for patient symptoms likely due to intracranial stenosis which secondary to multiple uncontrolled risk factors, including uncontrolled hypertensive emergency, smoking, drug use, obesity.  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  BP still on the high and however, agree with permissive hypertension for 24 to 48 hours.  Now on aspirin 81 and Plavix 75 DAPT, once BP under better control, consider aspirin 325 and Plavix 75 DAPT for 3 months and then aspirin alone.  On Lipitor 40.  Aggressive risk factor modification including better BP control, quit smoking, quit substance, lifestyle changes.  PT/OT recommend CIR.  Pending TEE tomorrow.  We will follow  For detailed assessment and plan, please refer to above as I have made changes wherever appropriate.   Marvel Plan, MD PhD Stroke Neurology 01/29/2022 7:17 PM      To contact Stroke Continuity provider, please refer to [email or web address]. After hours, contact General Neurology

## 2022-01-29 NOTE — Progress Notes (Signed)
?  Echocardiogram ?2D Echocardiogram has been performed. ? ?Sheralyn Boatman R ?01/29/2022, 11:30 AM ?

## 2022-01-29 NOTE — Progress Notes (Signed)
? ?  [facility] has been requested to perform a transesophageal echocardiogram on Krystal Sherman for stroke.  After careful review of history and examination, the risks and benefits of transesophageal echocardiogram have been explained including risks of esophageal damage, perforation (1:10,000 risk), bleeding, pharyngeal hematoma as well as other potential complications associated with conscious sedation including aspiration, arrhythmia, respiratory failure and death. Alternatives to treatment were discussed, questions were answered. Patient is willing to proceed.  ? ?Procedure is scheduled for 01/30/2022 at 9:30am with Dr. Bjorn Pippin. Will make NPO at midnight and place pre-procedural orders.  ? ?Of note, patient is very drowsy at this time and I did have to wake her up a couple of times while consenting her; however, she is oriented and at the end she was able to explain procedure and biggest risks to me. Recommend reviewing procedure again with her tomorrow prior to procedure. ? ?Corrin Parker, PA-C ?01/29/2022 11:59 AM  ? ?

## 2022-01-29 NOTE — ED Notes (Signed)
RN provided up date to pt fiance regarding pt status.  ?

## 2022-01-29 NOTE — Progress Notes (Signed)
PROGRESS NOTE        PATIENT DETAILS Name: Krystal Sherman Age: 41 y.o. Sex: female Date of Birth: [birth year] Admit Date: 01/28/2022 Admitting Physician Rhetta Mura, DO IYM:[facility]RAXENMM[facility], [facility]  Brief Summary: 41 year old with prior history of CVA, methamphetamine use-presented with left sided weakness-was found to have acute CVA.  See below for further details.  Significant Hospital events: 3/8>> admit to [facility] for left-sided weakness x2 days-found to have acute CVA.  Significant studies: 3/9>> MRI brain: Acute infarction involving right temporoparietal cortex, superior right frontal cortex and watershed pattern. 3/9>> brain MRA: Degraded by motion-advanced intracranial stenosis with a flow gap in the right supraclinoid ICA and left carotid terminus. 3/9>> neck MRA: Negative Motion degraded assessment 3/9>> CT angio head/neck: Carotid artery/vertebral artery normal in the neck.  Irregular stenosis in the cavernous carotid bilaterally, segmental occlusion of left M1 segment, decreased flow in the left MCA branches.  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 3/9>> A1c: 4.7 3/9>> LDL: 91 3/9>> vitamin B12: 317 3/9>> CRP: 0.9 3/9>> ESR: Pending 3/9>> ANA: Pending 3/9>> hypercoagulable work-up: Pending  Significant microbiology data: 3/8>> COVID/influenza PCR: Negative  Procedures: None  Consults:  Neurology  Subjective: Lying comfortably in bed-denies any chest pain or shortness of breath.  Objective: Vitals: Blood pressure (!) 224/137, pulse 87, temperature 98.6 F (37 C), temperature source Oral, resp. rate (!) 21, SpO2 100 %.   Exam: Gen Exam:Alert awake-not in any distress HEENT:atraumatic, normocephalic Chest: B/L clear to auscultation anteriorly CVS:S1S2 regular Abdomen:soft non tender, non distended Extremities:no edema Neurology: Slight left-sided weakness. Skin: no rash  Pertinent Labs/Radiology: CBC Latest Ref Rng & Units 01/29/2022  01/28/2022 09/10/2019  WBC 4.0 - 10.5 K/uL 7.8 9.1 6.8  Hemoglobin 12.0 - 15.0 g/dL 13.3 13.2 11.8(L)  Hematocrit 36.0 - 46.0 % 40.7 40.6 37.4  Platelets 150 - 400 K/uL 229 218 269    Lab Results  Component Value Date   NA 135 01/29/2022   K 3.7 01/29/2022   CL 103 01/29/2022   CO2 24 01/29/2022      Assessment/Plan: Acute CVA: Presenting with slight left-sided weakness-work-up as above-concern for moyamoya disease but given watershed pattern on MRI brain-embolic etiology also being considered.  Per nursing staff-patient passed swallow screen-and consumed breakfast earlier this morning without any issues.  Continue telemetry monitoring, spoke with cardiology-TEE scheduled for tomorrow.  In the meantime, continue aspirin/Plavix/statin-await further input from neurology.  HTN: Allow permissive hypertension-we will gradually lower blood pressure over the next few days.  On IV hydralazine/labetalol as needed.  Borderline vitamin B12 deficiency: Begin parenteral supplementation-we will place on oral supplementation on discharge.  Ongoing methamphetamine use: Claims to smoke methamphetamine daily-counseled this morning  History of cocaine use: Claims she does not do cocaine any longer-last use was several months back.   BMI: Estimated body mass index is 29.05 kg/m as calculated from the following:   Height as of 09/10/19: '5\' 6"'  (1.676 m).   Weight as of 09/10/19: 81.6 kg.   Code status:   Code Status: Full Code   DVT Prophylaxis: SCDs Start: 01/29/22 0123   Family Communication: None at bedside   Disposition Plan: Status is: Observation The patient will require care spanning > 2 midnights and should be moved to inpatient because: Acute CVA-extensive work-up including TEE planned for 3/10.   Planned Discharge Destination:Home   Diet: Diet Order  Diet NPO time specified  Diet effective now                     Antimicrobial agents: Anti-infectives (From  admission, onward)    None        MEDICATIONS: Scheduled Meds:   stroke: mapping our early stages of recovery book   Does not apply Once   aspirin EC  81 mg Oral Daily   atorvastatin  40 mg Oral Daily   clopidogrel  75 mg Oral Daily   Continuous Infusions: PRN Meds:.acetaminophen **OR** acetaminophen, hydrALAZINE, labetalol   I have personally reviewed following labs and imaging studies  LABORATORY DATA: CBC: Recent Labs  Lab 01/28/22 2350 01/29/22 0915  WBC 9.1 7.8  NEUTROABS 5.6  --   HGB 13.2 13.3  HCT 40.6 40.7  MCV 84.6 85.3  PLT 218 440    Basic Metabolic Panel: Recent Labs  Lab 01/28/22 2350 01/29/22 0915  NA 137 135  K 3.4* 3.7  CL 104 103  CO2 23 24  GLUCOSE 112* 149*  BUN 9 9  CREATININE 0.90 0.86  CALCIUM 8.8* 8.7*    GFR: CrCl cannot be calculated (Unknown ideal weight.).  Liver Function Tests: Recent Labs  Lab 01/28/22 2350  AST 19  ALT 16  ALKPHOS 100  BILITOT 0.4  PROT 6.5  ALBUMIN 3.3*   No results for input(s): LIPASE, AMYLASE in the last 168 hours. No results for input(s): AMMONIA in the last 168 hours.  Coagulation Profile: Recent Labs  Lab 01/28/22 2350  INR 0.9    Cardiac Enzymes: No results for input(s): CKTOTAL, CKMB, CKMBINDEX, TROPONINI in the last 168 hours.  BNP (last 3 results) No results for input(s): PROBNP in the last 8760 hours.  Lipid Profile: Recent Labs    01/29/22 0437  CHOL 155  HDL 32*  LDLCALC 91  TRIG 160*  CHOLHDL 4.8    Thyroid Function Tests: Recent Labs    01/29/22 0915  TSH 3.436    Anemia Panel: Recent Labs    01/29/22 0915  VITAMINB12 317    Urine analysis:    Component Value Date/Time   COLORURINE YELLOW 01/29/2022 0205   APPEARANCEUR HAZY (A) 01/29/2022 0205   LABSPEC 1.019 01/29/2022 0205   PHURINE 6.0 01/29/2022 0205   GLUCOSEU NEGATIVE 01/29/2022 0205   HGBUR NEGATIVE 01/29/2022 0205   BILIRUBINUR NEGATIVE 01/29/2022 0205   KETONESUR NEGATIVE  01/29/2022 0205   PROTEINUR NEGATIVE 01/29/2022 0205   NITRITE NEGATIVE 01/29/2022 0205   LEUKOCYTESUR TRACE (A) 01/29/2022 0205    Sepsis Labs: Lactic Acid, Venous No results found for: LATICACIDVEN  MICROBIOLOGY: Recent Results (from the past 240 hour(s))  Resp Panel by RT-PCR (Flu A&B, Covid) Nasopharyngeal Swab     Status: None   Collection Time: 01/28/22 11:44 PM   Specimen: Nasopharyngeal Swab; Nasopharyngeal(NP) swabs in vial transport medium  Result Value Ref Range Status   SARS Coronavirus 2 by RT PCR NEGATIVE NEGATIVE Final    Comment: (NOTE) SARS-CoV-2 target nucleic acids are NOT DETECTED.  The SARS-CoV-2 RNA is generally detectable in upper respiratory specimens during the acute phase of infection. The lowest concentration of SARS-CoV-2 viral copies this assay can detect is 138 copies/mL. A negative result does not preclude SARS-Cov-2 infection and should not be used as the sole basis for treatment or other patient management decisions. A negative result may occur with  improper specimen collection/handling, submission of specimen other than nasopharyngeal swab, presence of viral mutation(s) within  the areas targeted by this assay, and inadequate number of viral copies(<138 copies/mL). A negative result must be combined with clinical observations, patient history, and epidemiological information. The expected result is Negative.  Fact Sheet for Patients:  EntrepreneurPulse.com.au  Fact Sheet for Healthcare Providers:  IncredibleEmployment.be  This test is no t yet approved or cleared by the [place] FDA and  has been authorized for detection and/or diagnosis of SARS-CoV-2 by FDA under an Emergency Use Authorization (EUA). This EUA will remain  in effect (meaning this test can be used) for the duration of the COVID-19 declaration under Section 564(b)(1) of the Act, 21 U.S.C.section 360bbb-3(b)(1), unless the authorization  is terminated  or revoked sooner.       Influenza A by PCR NEGATIVE NEGATIVE Final   Influenza B by PCR NEGATIVE NEGATIVE Final    Comment: (NOTE) The Xpert Xpress SARS-CoV-2/FLU/RSV plus assay is intended as an aid in the diagnosis of influenza from Nasopharyngeal swab specimens and should not be used as a sole basis for treatment. Nasal washings and aspirates are unacceptable for Xpert Xpress SARS-CoV-2/FLU/RSV testing.  Fact Sheet for Patients: EntrepreneurPulse.com.au  Fact Sheet for Healthcare Providers: IncredibleEmployment.be  This test is not yet approved or cleared by the [place] FDA and has been authorized for detection and/or diagnosis of SARS-CoV-2 by FDA under an Emergency Use Authorization (EUA). This EUA will remain in effect (meaning this test can be used) for the duration of the COVID-19 declaration under Section 564(b)(1) of the Act, 21 U.S.C. section 360bbb-3(b)(1), unless the authorization is terminated or revoked.  Performed at [facility], [place] [address], [place], [place] [address]     RADIOLOGY STUDIES/RESULTS: CT ANGIO HEAD NECK W WO CM  Result Date: 01/29/2022 CLINICAL DATA:  Stroke follow-up.  Left-sided weakness EXAM: CT ANGIOGRAPHY HEAD AND NECK TECHNIQUE: Multidetector CT imaging of the head and neck was performed using the standard protocol during bolus administration of intravenous contrast. Multiplanar CT image reconstructions and MIPs were obtained to evaluate the vascular anatomy. Carotid stenosis measurements (when applicable) are obtained utilizing NASCET criteria, using the distal internal carotid diameter as the denominator. RADIATION DOSE REDUCTION: This exam was performed according to the departmental dose-optimization program which includes automated exposure control, adjustment of the mA and/or kV according to patient size and/or use of iterative reconstruction technique. CONTRAST:  120m  OMNIPAQUE IOHEXOL 350 MG/ML SOLN COMPARISON:  CT head and MRI head 01/29/2022 FINDINGS: CT HEAD FINDINGS Brain: Hypodensity right parietal lobe unchanged compatible with acute infarct. No associated hemorrhage. Chronic infarct right occipital lobe unchanged. Chronic infarct left basal ganglia unchanged. Chronic infarct left frontal parietal lobe unchanged. Ventricle size normal.  No acute hemorrhage. Vascular: Negative for hyperdense vessel Skull: Negative Sinuses: Mild mucosal edema paranasal sinuses. Orbits: Negative Review of the MIP images confirms the above findings CTA NECK FINDINGS Aortic arch: Standard branching. Imaged portion shows no evidence of aneurysm or dissection. No significant stenosis of the major arch vessel origins. Right carotid system: Right carotid bifurcation normal. Negative for atherosclerotic disease or dissection. Tortuous right internal carotid artery Left carotid system: Left carotid bifurcation normal. No atherosclerotic disease or dissection. Tortuous left internal carotid artery. Vertebral arteries: Normal vertebral arteries bilaterally. No stenosis or dissection. Skeleton: Incomplete segmentation of C6 and C7, congenital. No acute skeletal abnormality. Other neck: Negative for mass or adenopathy in the neck. Upper chest: Lung apices clear bilaterally. Review of the MIP images confirms the above findings CTA HEAD FINDINGS Anterior circulation: Irregularity and  diffuse narrowing of the cavernous carotid bilaterally. Scattered small calcifications. Moderate stenosis right cavernous and supraclinoid internal carotid artery. Mild stenosis left cavernous and supraclinoid internal carotid artery. Anterior cerebral artery patent bilaterally without stenosis. Right M1 segment patent. Relatively small right middle cerebral artery without stenosis or large vessel occlusion. Segmental occlusion left M1 segment. There are collaterals in the area with decreased opacification of left MCA branches  due to collaterals flow. Posterior circulation: Both vertebral arteries patent to the basilar. PICA patent bilaterally. Basilar patent. Fetal origin left posterior cerebral artery. Both posterior cerebral arteries are patent. Mild stenosis right PCA. No aneurysm. Venous sinuses: Normal venous enhancement Anatomic variants: None Review of the MIP images confirms the above findings IMPRESSION: 1. Moderately large acute infarct right parietal lobe unchanged. No acute hemorrhage. 2. Chronic infarcts in the right occipital lobe, left frontal lobe, left basal ganglia unchanged. Advanced cerebrovascular disease for age. 3. Carotid artery and vertebral artery normal in the neck. No significant atherosclerotic disease in the neck. 4. Irregular stenosis in the cavernous carotid bilaterally, right greater than left. Several small calcifications are present in the cavernous carotid bilaterally 5. Segmental occlusion left M1 segment. Decreased flow in the left MCA branches due to collateral flow. 6. 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 Intracranial vasculitis possible. Intracranial atherosclerotic disease possible however lack of atherosclerotic disease in the neck and the patient's age reduce the likelihood of atherosclerotic disease. Electronically Signed   By: Franchot Gallo M.D.   On: 01/29/2022 09:16   CT HEAD WO CONTRAST (5MM)  Result Date: 01/29/2022 CLINICAL DATA:  Initial evaluation for left-sided weakness for 2 days. EXAM: CT HEAD WITHOUT CONTRAST TECHNIQUE: Contiguous axial images were obtained from the base of the skull through the vertex without intravenous contrast. RADIATION DOSE REDUCTION: This exam was performed according to the departmental dose-optimization program which includes automated exposure control, adjustment of the mA and/or kV according to patient size and/or use of iterative reconstruction technique. COMPARISON:  Prior head CT from 09/10/2019. FINDINGS: Brain: Cerebral volume within normal limits for age.  Chronic encephalomalacia involving the anterior left frontal lobe consistent with a chronic left MCA distribution infarct. Additional chronic left basal ganglia infarct noted. Evolving cytotoxic edema present within the right parieto-occipital region, consistent with an acute to early subacute right MCA distribution and/or MCA/PCA watershed distribution infarct. Suspected faint petechial blood products without frank hemorrhagic transformation. Localized edema without significant regional mass effect. No other acute large vessel territory infarct or intracranial hemorrhage. No mass lesion or significant midline shift. No hydrocephalus or extra-axial fluid collection. Vascular: No hyperdense vessel. Skull: Scalp soft tissues demonstrate no acute finding. Calvarium intact. Sinuses/Orbits: Globes and orbital soft tissues demonstrate no acute finding. Small right sphenoid sinus retention cyst noted. Paranasal sinuses are otherwise clear. Moderate right with small left mastoid effusions. Other: None. IMPRESSION: 1. Evolving cytotoxic edema within the right parieto-occipital region, consistent with an acute to early subacute right MCA and/or MCA/PCA watershed distribution infarct. Probable associated faint petechial blood products without frank hemorrhagic transformation. Localized edema without significant regional mass effect. 2. No other acute intracranial abnormality. 3. 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 Electronically Signed   By: Jeannine Boga M.D.   On: 01/29/2022 00:46   MR ANGIO HEAD WO CONTRAST  Result Date: 01/29/2022 CLINICAL DATA:  Stroke-like symptoms. Left-sided weakness since Monday morning EXAM: MRI HEAD WITHOUT CONTRAST MRA HEAD WITHOUT CONTRAST MRA OF THE NECK WITHOUT AND WITH CONTRAST TECHNIQUE: Multiplanar, multi-echo pulse sequences of the brain and surrounding  structures were acquired without intravenous contrast. Angiographic images of the Circle of Willis were acquired  using MRA technique without intravenous contrast. Angiographic images of the neck were acquired using MRA technique without and with intravenous contrast. Carotid stenosis measurements (when applicable) are obtained utilizing NASCET criteria, using the distal internal carotid diameter as the denominator. CONTRAST:  7.84m GADAVIST GADOBUTROL 1 MMOL/ML IV SOLN COMPARISON:  Head CT from earlier the same day FINDINGS: MR HEAD FINDINGS Brain: Extensive primarily cortically based right cerebral infarction affecting the temporoparietal cortex and extending superiorly along the superior right frontal cortex. Mild T1 hyperintensity at the infarct compatible with petechial blood products when correlated with CT. Pre-existing right occipital and left superior frontal cortex infarcts. Chronic perforator infarct at the left basal ganglia. No hematoma, hydrocephalus, or herniation. Pervasive motion artifact. Chronic blood products at the remote perforator infarct on the left. Vascular: See below Skull and upper cervical spine: No focal marrow lesion Sinuses/Orbits: Negative Other: Motion artifact. MRA HEAD FINDINGS Anterior circulation: Irregular appearance of the carotid siphons with flow gap at the right supraclinoid segment. No flow seen at the left M1 or A1 segments with diminutive downstream signal. [facility] Posterior circulation: Patent vertebral and basilar arteries. Fetal type left PCA flow. Bilateral PCA narrowing, particularly high-grade at the right P1 and P3 segments. Anatomic variants:As above. MRA NECK FINDINGS Aortic arch: Unremarkable with 3 vessel branching Right carotid system: Tortuous ICA. No evidence of stenosis or dissection. Left carotid system: Tortuous ICA. No visible stenosis or dissection. Vertebral arteries: The vertebral arteries are smoothly contoured and diffusely patent. IMPRESSION: Brain MRI: 1. Extensive acute infarction in the right temporoparietal cortex  and in the superior right frontal cortex, roughly watershed pattern. 2. Right occipital cortex infarct which is chronic but has occurred since May 2022 comparison. More remote left superior frontal infarct. 3. Motion degraded Intracranial MRA: 1. Degraded by motion. 2. Advanced intracranial stenoses with flow gap at the right supraclinoid ICA and left carotid terminus/A1/M1 segments. Question moyamoya disease. Neck MRA: Negative motion degraded assessment. Electronically Signed   By: JJorje GuildM.D.   On: 01/29/2022 07:12   MR ANGIO NECK W WO CONTRAST  Result Date: 01/29/2022 CLINICAL DATA:  Stroke-like symptoms. Left-sided weakness since Monday morning EXAM: MRI HEAD WITHOUT CONTRAST MRA HEAD WITHOUT CONTRAST MRA OF THE NECK WITHOUT AND WITH CONTRAST TECHNIQUE: Multiplanar, multi-echo pulse sequences of the brain and surrounding structures were acquired without intravenous contrast. Angiographic images of the Circle of Willis were acquired using MRA technique without intravenous contrast. Angiographic images of the neck were acquired using MRA technique without and with intravenous contrast. Carotid stenosis measurements (when applicable) are obtained utilizing NASCET criteria, using the distal internal carotid diameter as the denominator. CONTRAST:  7.570mGADAVIST GADOBUTROL 1 MMOL/ML IV SOLN COMPARISON:  Head CT from earlier the same day FINDINGS: MR HEAD FINDINGS Brain: Extensive primarily cortically based right cerebral infarction affecting the temporoparietal cortex and extending superiorly along the superior right frontal cortex. Mild T1 hyperintensity at the infarct compatible with petechial blood products when correlated with CT. Pre-existing right occipital and left superior frontal cortex infarcts. Chronic perforator infarct at the left basal ganglia. No hematoma, hydrocephalus, or herniation. Pervasive motion artifact. Chronic blood products at the remote perforator infarct on the left. Vascular:  See below Skull and upper cervical spine: No focal marrow lesion Sinuses/Orbits: Negative Other: Motion artifact. MRA HEAD FINDINGS Anterior circulation: Irregular appearance of the carotid siphons with  flow gap at the right supraclinoid segment. No flow seen at the left M1 or A1 segments with diminutive downstream signal. Reportedly there was a normal MRA in 2019 at [facility]. Posterior circulation: Patent vertebral and basilar arteries. Fetal type left PCA flow. Bilateral PCA narrowing, particularly high-grade at the right P1 and P3 segments. Anatomic variants:As above. MRA NECK FINDINGS Aortic arch: Unremarkable with 3 vessel branching Right carotid system: Tortuous ICA. No evidence of stenosis or dissection. Left carotid system: Tortuous ICA. No visible stenosis or dissection. Vertebral arteries: The vertebral arteries are smoothly contoured and diffusely patent. IMPRESSION: Brain MRI: 1. Extensive acute infarction in the right temporoparietal cortex and in the superior right frontal cortex, roughly watershed pattern. 2. Right occipital cortex infarct which is chronic but has occurred since May 2022 comparison. More remote left superior frontal infarct. 3. Motion degraded Intracranial MRA: 1. Degraded by motion. 2. Advanced intracranial stenoses with flow gap at the right supraclinoid ICA and left carotid terminus/A1/M1 segments. Question moyamoya disease. Neck MRA: Negative motion degraded assessment. Electronically Signed   By: Jorje Guild M.D.   On: 01/29/2022 07:12   MR BRAIN WO CONTRAST  Result Date: 01/29/2022 CLINICAL DATA:  Stroke-like symptoms. Left-sided weakness since Monday morning EXAM: MRI HEAD WITHOUT CONTRAST MRA HEAD WITHOUT CONTRAST MRA OF THE NECK WITHOUT AND WITH CONTRAST TECHNIQUE: Multiplanar, multi-echo pulse sequences of the brain and surrounding structures were acquired without intravenous contrast. Angiographic images of the Circle of Willis were acquired using MRA technique without  intravenous contrast. Angiographic images of the neck were acquired using MRA technique without and with intravenous contrast. Carotid stenosis measurements (when applicable) are obtained utilizing NASCET criteria, using the distal internal carotid diameter as the denominator. CONTRAST:  7.25m GADAVIST GADOBUTROL 1 MMOL/ML IV SOLN COMPARISON:  Head CT from earlier the same day FINDINGS: MR HEAD FINDINGS Brain: Extensive primarily cortically based right cerebral infarction affecting the temporoparietal cortex and extending superiorly along the superior right frontal cortex. Mild T1 hyperintensity at the infarct compatible with petechial blood products when correlated with CT. Pre-existing right occipital and left superior frontal cortex infarcts. Chronic perforator infarct at the left basal ganglia. No hematoma, hydrocephalus, or herniation. Pervasive motion artifact. Chronic blood products at the remote perforator infarct on the left. Vascular: See below Skull and upper cervical spine: No focal marrow lesion Sinuses/Orbits: Negative Other: Motion artifact. MRA HEAD FINDINGS Anterior circulation: Irregular appearance of the carotid siphons with flow gap at the right supraclinoid segment. No flow seen at the left M1 or A1 segments with diminutive downstream signal. [facility] Posterior circulation: Patent vertebral and basilar arteries. Fetal type left PCA flow. Bilateral PCA narrowing, particularly high-grade at the right P1 and P3 segments. Anatomic variants:As above. MRA NECK FINDINGS Aortic arch: Unremarkable with 3 vessel branching Right carotid system: Tortuous ICA. No evidence of stenosis or dissection. Left carotid system: Tortuous ICA. No visible stenosis or dissection. Vertebral arteries: The vertebral arteries are smoothly contoured and diffusely patent. IMPRESSION: Brain MRI: 1. Extensive acute infarction in the right temporoparietal cortex and in the superior right  frontal cortex, roughly watershed pattern. 2. Right occipital cortex infarct which is chronic but has occurred since May 2022 comparison. More remote left superior frontal infarct. 3. Motion degraded Intracranial MRA: 1. Degraded by motion. 2. Advanced intracranial stenoses with flow gap at the right supraclinoid ICA and left carotid terminus/A1/M1 segments. Question moyamoya disease. Neck MRA: Negative motion degraded assessment. Electronically Signed  By: Jorje Guild M.D.   On: 01/29/2022 07:12     LOS: 0 days   Oren Binet, MD  [facility]    To contact the attending provider between 7A-7P or the covering provider during after hours 7P-7A, please log into the web site [email or web address] and access using universal [place] password for that web site. If you do not have the password, please call the hospital operator.  01/29/2022, 11:22 AM

## 2022-01-29 NOTE — ED Notes (Signed)
Pt passed swallow screen. Per Misty Stanley PA, RN provided pt with [place] sandwich and water. ?

## 2022-01-29 NOTE — Evaluation (Signed)
Occupational Therapy Evaluation Patient Details Name: Krystal Sherman MRN: [id number] DOB: 01-08-1981 Today's Date: 01/29/2022   History of Present Illness Pt is a 41 y/o female presenting on 3/8 with L sided weakness. MRI with extensive acute infarction in R temporoparietal cortex and in superior R frontal cortext, roughly watershed pattern, R occupital cortex infract chronic (but new since last CVA).  PMH includes: CVA with residual R sided weakness, HTN, and polysubstance abuse.   Clinical Impression   PTA patient independent with ADLs, mobility.  Admitted for above and limited by problem list below, including L sided weakness and impaired coordination, L inattention, visual deficits, impaired balance and decreased activity tolerance.  Patient currently requires up to min assist for ADLs, min assist for transfers, further mobility limited due to elevated BP.  Patient requires cueing to attend to L side, demonstrates poor awareness, safety, problem solving throughout session.  Believe she will benefit from continued OT services while admitted and after dc at CIR level to optimize independence, safety and return to PLOF with ADLs and mobility.      Recommendations for follow up therapy are one component of a multi-disciplinary discharge planning process, led by the attending physician.  Recommendations may be updated based on patient status, additional functional criteria and insurance authorization.   Follow Up Recommendations  Acute inpatient rehab (3hours/day)    Assistance Recommended at Discharge Frequent or constant Supervision/Assistance  Patient can return home with the following A little help with walking and/or transfers;A little help with bathing/dressing/bathroom;Assistance with cooking/housework;Direct supervision/assist for medications management;Direct supervision/assist for financial management;Assist for transportation;Help with stairs or ramp for entrance    Functional Status  Assessment  Patient has had a recent decline in their functional status and demonstrates the ability to make significant improvements in function in a reasonable and predictable amount of time.  Equipment Recommendations  Other (comment) (TBD)    Recommendations for Other Services Speech consult     Precautions / Restrictions Precautions Precautions: Fall Precaution Comments: monitor BP Restrictions Weight Bearing Restrictions: No      Mobility Bed Mobility Overal bed mobility: Needs Assistance Bed Mobility: Supine to Sit, Sit to Supine     Supine to sit: Min guard, HOB elevated Sit to supine: Min guard   General bed mobility comments: min guard for safety andbalance.    Transfers                          Balance Overall balance assessment: Needs assistance Sitting-balance support: No upper extremity supported, Feet supported Sitting balance-Leahy Scale: Fair Sitting balance - Comments: min guard for safety   Standing balance support: No upper extremity supported, During functional activity Standing balance-Leahy Scale: Poor Standing balance comment: min gaurd to min assist                           ADL either performed or assessed with clinical judgement   ADL Overall ADL's : Needs assistance/impaired     Grooming: Minimal assistance;Sitting           Upper Body Dressing : Minimal assistance;Sitting   Lower Body Dressing: Minimal assistance;Sit to/from stand   Toilet Transfer: Minimal assistance Statistician Details (indicate cue type and reason): simulated side stepping towards HOB         Functional mobility during ADLs: Minimal assistance;Cueing for safety;Cueing for sequencing General ADL Comments: pt limited by L sided weakness and inattention, cognition  Vision Baseline Vision/History: 0 No visual deficits Ability to See in Adequate Light: 0 Adequate Patient Visual Report: No change from baseline Vision  Assessment?: Yes Eye Alignment: Within Functional Limits Ocular Range of Motion: Within Functional Limits (initally restricted on the L but progressed to full L scanning) Alignment/Gaze Preference: Within Defined Limits Tracking/Visual Pursuits: Decreased smoothness of horizontal tracking;Requires cues, head turns, or add eye shifts to track Visual Fields: Other (comment) (L visual deficits? vs inattention)     Perception     Praxis      Pertinent Vitals/Pain Pain Assessment Pain Assessment: Faces Faces Pain Scale: No hurt     Hand Dominance Right   Extremity/Trunk Assessment Upper Extremity Assessment Upper Extremity Assessment: RUE deficits/detail;LUE deficits/detail RUE Deficits / Details: baseline from prior CVA with weakness, decreased coordination, and sesnsation.  Grossly 3-/5 MMT RUE Sensation: decreased light touch RUE Coordination: decreased fine motor;decreased gross motor LUE Deficits / Details: inattention to UE, grossly 3-/5 MMT with decreased coordination and sensation LUE Sensation: decreased light touch;decreased proprioception LUE Coordination: decreased fine motor;decreased gross motor   Lower Extremity Assessment Lower Extremity Assessment: Defer to PT evaluation RLE Deficits / Details: 4/5 MMTs with RLE. Decreased coordination noted with heel to shin test. Slow processing for cues still present. RLE Coordination: decreased gross motor LLE Deficits / Details: Required cues for coordinating LE ROM and strength screen tasks due to slow processing and motor planning. She exhibited significant ER of the L hip with decreased IR; when asked to raise her LLE in supine, she adbucted and externally rotated her leg with foot sliding on ED mattress (gravity eliminated hip flexion). 2+/5 MMT for LLE hip and knee flexion. 3+/5 MMTs for L ankle DF and PF. Decreased coordination with tasks and the heel to shin test. LLE Coordination: decreased gross motor   Cervical / Trunk  Assessment Cervical / Trunk Assessment: Normal   Communication Communication Communication: Expressive difficulties (mumbles)   Cognition Arousal/Alertness: Lethargic Behavior During Therapy: Flat affect Overall Cognitive Status: No family/caregiver present to determine baseline cognitive functioning Area of Impairment: Attention, Memory, Following commands, Safety/judgement, Awareness, Problem solving                   Current Attention Level: Sustained Memory: Decreased short-term memory, Decreased recall of precautions Following Commands: Follows one step commands consistently, Follows one step commands with increased time, Follows multi-step commands inconsistently Safety/Judgement: Decreased awareness of safety, Decreased awareness of deficits Awareness: Emergent Problem Solving: Slow processing, Decreased initiation, Difficulty sequencing, Requires verbal cues, Requires tactile cues General Comments: pt able to recall date once re-oriented, poor awareness to safety and deficits, poor problem sovling.  Clock drawing test with L side of clock not filled in, numbers 12-7 in a line and 9-11 horizontally near middle of clock.     General Comments  elevated BP- RN aware    Exercises     Shoulder Instructions      Home Living Family/patient expects to be discharged to:: Shelter/Homeless Living Arrangements: Spouse/significant other Available Help at Discharge: Available PRN/intermittently;Other (Comment) Type of Home: Homeless Home Access: Other (comment) (steep hill to get to tent)                     Home Equipment: None   Additional Comments: lives in tent with fiance.      Prior Functioning/Environment Prior Level of Function : Needs assist       Physical Assist : Mobility (physical) Mobility (physical): Gait  Mobility Comments: SO holds her hand while she goes up and down steep hill to her tent ADLs Comments: reports independent        OT  Problem List: Decreased strength;Decreased activity tolerance;Impaired balance (sitting and/or standing);Impaired vision/perception;Decreased coordination;Decreased cognition;Decreased safety awareness;Decreased knowledge of use of DME or AE;Decreased knowledge of precautions;Impaired UE functional use;Obesity      OT Treatment/Interventions: Self-care/ADL training;Therapeutic exercise;DME and/or AE instruction;Therapeutic activities;Cognitive remediation/compensation;Patient/family education;Balance training;Visual/perceptual remediation/compensation;Neuromuscular education    OT Goals(Current goals can be found in the care plan section) Acute Rehab OT Goals Patient Stated Goal: back home OT Goal Formulation: With patient Time For Goal Achievement: 02/12/22 Potential to Achieve Goals: Good ADL Goals Pt Will Perform Grooming: with supervision;standing Pt Will Perform Lower Body Dressing: with supervision;sit to/from stand Pt Will Transfer to Toilet: with supervision;ambulating Pt Will Perform Toileting - Clothing Manipulation and hygiene: with supervision;sit to/from stand Pt/caregiver will Perform Home Exercise Program: Increased strength;Both right and left upper extremity;With written HEP provided Additional ADL Goal #1: Pt will locate and 3/5 items on L side during ADL task at sink with no more than minimal cueing. Additional ADL Goal #2: Pt will complete 3 step trail making task with supervision, using compensatory techniques as needed for problem solving.  OT Frequency: Min 2X/week    Co-evaluation PT/OT/SLP Co-Evaluation/Treatment: Yes Reason for Co-Treatment: Complexity of the patient's impairments (multi-system involvement);For patient/therapist safety;To address functional/ADL transfers PT goals addressed during session: Mobility/safety with mobility;Balance;Strengthening/ROM OT goals addressed during session: ADL's and self-care      AM-PAC OT "6 Clicks" Daily Activity      Outcome Measure Help from another person eating meals?: A Little Help from another person taking care of personal grooming?: A Little Help from another person toileting, which includes using toliet, bedpan, or urinal?: A Little Help from another person bathing (including washing, rinsing, drying)?: A Little Help from another person to put on and taking off regular upper body clothing?: A Little Help from another person to put on and taking off regular lower body clothing?: A Little 6 Click Score: 18   End of Session Equipment Utilized During Treatment: Gait belt Nurse Communication: Mobility status;Other (comment) (BP)  Activity Tolerance: Patient tolerated treatment well;Other (comment) (limited by BP) Patient left: in bed;with call bell/phone within reach  OT Visit Diagnosis: Other abnormalities of gait and mobility (R26.89);Muscle weakness (generalized) (M62.81);Other symptoms and signs involving cognitive function;Other symptoms and signs involving the nervous system (R29.898);Low vision, both eyes (H54.2)                Time: 6295-2841 OT Time Calculation (min): 25 min Charges:  OT General Charges $OT Visit: 1 Visit OT Evaluation $OT Eval Moderate Complexity: 1 Mod  Barry Brunner, OT Acute Rehabilitation Services Pager [phone] Office [phone]   Chancy Milroy 01/29/2022, 12:21 PM

## 2022-01-29 NOTE — Consult Note (Signed)
Neurology Consult H&P ? ?[name] ?MR# [id number] ?01/29/2022 ? ? ?CC: left sided weakness ? ?History is obtained from: patient and chart. ? ?HPI: Krystal Sherman is a 41 y.o. female PMHx substance abuse, HTN, stroke woke up Monday morning with left-sided weakness which she feels is worse in her arm than in her leg.  She states when she walks she has to drag her leg because it "just does not work."  She complains of associated blurry vision in her left eye which is intermittent accompanied by headache.  She states she did use methamphetamine yesterday and has not been taking her home medications.  On arrival her blood pressure was 223/124 which she says is within her normal range.  She is lost to follow-up with outpatient neurology and does not take antiplatelets. ? ?LKW: Unclear ?tNK given: No OS W ?IR Thrombectomy No, not indicated ?Modified Rankin Scale: 1-No significant post stroke disability and can perform usual duties with stroke symptoms ?NIHSS: 0 ? ?ROS: A complete ROS was performed and is negative except as noted in the HPI. ? ?Social History:  reports that she has never smoked. She does not have any smokeless tobacco history on file. She reports that she does not currently use alcohol. She reports that she does not use drugs. ? ? ?Exam: ?Current vital signs: ?BP (!) 181/110   Pulse 82   Temp 98.6 ?F (37 ?C) (Oral)   Resp 17   SpO2 97%  ? ?Physical Exam  ?Constitutional: Appears well-developed and well-nourished.  ?Psych: Affect appropriate to situation ?Eyes: No scleral injection ?HENT: No OP obstruction. ?Head: Normocephalic.  ?Cardiovascular: Normal rate and regular rhythm.  ?Respiratory: Effort normal, symmetric excursions bilaterally, no audible wheezing. ?GI: Soft.  No distension. There is no tenderness.  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 Tattoo right upper. ? ?Neuro: ?Mental Status: ?Patient is awake, alert, oriented to person, place, time and situation. ?Patient is able  to give a clear and coherent history. ?Speech poor dentition fluent, intact comprehension and repetition. ?No signs of aphasia or neglect. ?Visual Fields are full. Pupils are equal, round, and reactive to light. ?EOMI without ptosis or diplopia.  ?Facial sensation is symmetric to temperature ?Facial movement is symmetric.  ?Hearing is intact to voice. ?Uvula midline and palate elevates symmetrically. ?Shoulder shrug is symmetric. ?Tongue is midline without atrophy or fasciculations.  ?Tone is normal. Bulk is normal. 4+/5 strength was present in left extremities and 4-/5 left upper and 4/5 left lower. ?Sensation is symmetric to light touch and temperature in the arms and legs. ?Deep Tendon Reflexes: 2+ and symmetric in the biceps and patellae. ?Toes are downgoing bilaterally. ?FNF and HKS are intact bilaterally. ?Gait - Deferred ? ?I have reviewed labs in epic and the pertinent results are: ?Glu 112 ? ?I have reviewed the images obtained: ?MRI brain/MRA head and neck showed Extensive acute infarction in the right temporoparietal cortex and in the superior right frontal cortex, roughly watershed pattern. Right occipital cortex infarct which is chronic but has occurred since May 2022 comparison. More remote left superior frontal ?infarct.  ?Intracranial MRA: ?Degraded by motion. ?Advanced intracranial stenoses with flow gap at the right supraclinoid ICA and left carotid terminus/A1/M1 segments. Question ?moyamoya disease. ?  ? ?Assessment: Krystal Sherman is a 41 y.o. female PMHx as noted above with acute polysubstance abuse, crack use PTA previous stroke comes in with hypertensive emergency and acute left-sided weakness likely related to an embolic strokes.   ? ?Late addendum  MRI resulted and note updated. ? ?Impression:  ?Acute embolic strokes. ?Hypertensive emergency ?Uncontrolled hypertension ?Right supraclinoid ICA and left carotid terminus/A1/M1 segment flow gaps-questionable moyamoya. ?Polysubstance abuse ?Used  crack PTA ? ?Plan: ?- Recommend TTE. ?- Recommend labs: HbA1c, lipid panel. ?- Recommend Statin for goal LDL <70. ?- Goal A1c <7. ?- Aspirin 81mg  daily. ?- Clopidogrel 75mg  daily for 3 weeks. ?- SBP goal <180 for now. ?- Telemetry monitoring for arrhythmia. ?- Recommend bedside Swallow screen. ?- Recommend Stroke education. ?- Recommend PT/OT/SLP consult. ? ?-May consider diagnostic angiogram to further evaluate for moyamoya however when the patient is more stable. ? ?This patient is critically ill and at significant risk of neurological worsening, death and care requires constant monitoring of vital signs, hemodynamics,respiratory and cardiac monitoring, neurological assessment, discussion with family, other specialists and medical decision making of high complexity. I spent 70 minutes of neurocritical care time  in the care of  this patient. This was time spent independent of any time provided by nurse practitioner or PA. ? ?Electronically signed by:  ?Lynnae Sandhoff, MD ?Page: [phone] ?01/29/2022, 7:44 AM ? ?If 7pm- 7am, please page neurology on call as listed in [facility]. ? ?

## 2022-01-29 NOTE — ED Notes (Signed)
Patient transported to MRI 

## 2022-01-29 NOTE — ED Notes (Signed)
Fiance Prescott Gum [phone] wants an update ?

## 2022-01-29 NOTE — Assessment & Plan Note (Signed)
#)   Acute ischemic stroke: In the setting of new onset left-sided weakness, worse in the left upper extremity relative to left lower extremity, with last known normal around 2200 on 01/25/2022, with presenting CTh showing findings consistent with acute to early subacute right MCA and/or MCA/PCA distribution infarct without overt evidence of hemorrhagic conversion.  ?? ?Dr. Thomasena Edis of neurology to formally consult. He recommends admission to the Hospitalist service for further evaluation and management of acute ischemic stroke, including additional imaging in the form of MRI brain as well as MRA head and neck. Additionally, Dr Thomasena Edis  recommends further assessment of potential modifiable acute ischemic CVA risk factors, including checking hemoglobin A1c, lipid panel, and telemetry monitoring for paroxsymal atrial fibrillation.  ?? ?Of note, the patient as well as side of the window for tPA or thrombectomy.  Additionally, she presents outside the window for observance of permissive hypertension.  Will likely need to be started on oral antihypertensive medications with close monitoring of ensuing blood pressure potential modifiable acute ischemic CVA risk factor.  ?? ?This is in the context of a reported history of acute ischemic CVA approximately 3 years ago, with patient reporting portable compliance with her outpatient antiplatelet regimen, essentially stating that she has not taking any antiplatelet or statin medications at this time.  Will await neurology recommendations regarding antiplatelet intervention.  ?? ?? ?? ?? ?Plan: Nursing bedside swallow evaluation x 1 now, and will not initiate oral medications or diet until the patient has passed this. Head of the bed at 30 degrees. Neuro checks per protocol. with prn IV labetalol ordered for systolic blood pressure greater than 180 mmHg or diastolic blood pressure greater than 100 mmHg. Monitor on telemetry, including monitoring for atrial fibrillation as modifiable  risk factor for acute ischemic CVA.   MRI brain. MRA head without contrast and MRA neck with and without contrast.   TTE ordered for the morning.  Check lipid panel and A1c. PT/OT consults ordered for the morning.  Urology formally consulted, with additional recs to follow, including recommendations regarding antiplatelet intervention.  UDS. ?

## 2022-01-29 NOTE — Plan of Care (Signed)
Attempted to see patient but still in MRI ? ? ?Electronically signed by:  ?Marisue Humble, MD ?Page: [phone] ?01/29/2022, 6:47 AM ? ? ?

## 2022-01-29 NOTE — H&P (Signed)
History and Physical    PLEASE NOTE THAT DRAGON DICTATION SOFTWARE WAS USED IN THE CONSTRUCTION OF THIS NOTE.   Krystal Sherman NTI:[id number] DOB: [birth year] DOA: 01/28/2022  PCP: [facility]  Patient coming from: home   I have personally briefly reviewed patient's old medical records in [facility] Link  Chief Complaint: Left sided weakness  HPI: Krystal Sherman is a 41 y.o. female with medical history significant for acute ischemic stroke who is admitted to [facility] on 01/28/2022 with acute ischemic stroke after presenting from home to [facility] ED complaining of left-sided weakness.   The patient reports that she went to bed in the evening Sunday, 01/25/2022 completely asymptomatic, before waking on Monday morning, 01/26/2022, around 9 AM, noting interval development of left-sided weakness, will pronounced in the left upper extremity relative to the left lower extremity.  She reports no significant improvement in the degree of left-sided weakness following this onset, ultimately prompting her to present to [facility] emergency department this evening for further evaluation and management thereof.  Not associated with any acute focal numbness, paresthesias, facial droop, slurred speech, expressive aphasia, acute change in vision, dysphagia, vertigo.  Denies any associated chest pain, shortness of breath, palpitations, diaphoresis, presyncope, or syncope.  She acknowledges a history of acute ischemic stroke approximately 3 years ago, but reports that she has been suboptimally compliant in the interval on her antiplatelet therapy.  Otherwise, she denies any notable past medical history, including denial of underlying hypertension, hyperlipidemia, diabetes, atrial fibrillation, obstructive sleep apnea.  Not currently on any antilipid medications, including not on any statin medications at home.    ED Course:  Vital signs in the ED were notable for the following: Afebrile; heart  rate 7386; blood pressure 189/102; respiratory rate 16-20, oxygen saturation 97 to 100% on room air.  Labs were notable for the following: CBC notable for evidence of count 9100, hemoglobin 13.2.  INR 0.9.  Urinary drug screen ordered, with result currently pending.  COVID-19/influenza PCR negative.  Imaging and additional notable ED work-up: EKG shows sinus rhythm with heart rate 83, normal intervals, no evidence of T wave or ST changes, including no evidence of ST elevation.  Noncontrast CT head demonstrates findings consistent with acute to early subacute right MCA and or MCA/PCA distribution infarct without overt hemorrhagic conversion.  Otherwise, no evidence of acute intracranial process.  the patient's case and imaging were discussed with the on-call neurologist, Dr Thomasena Edis, who will formally consult. He recommends admission to the Hospitalist service for further evaluation and management of acute ischemic stroke, including additional imaging in the form of MRI brain as well as MRA head and neck. Additionally, Dr Thomasena Edis  recommends further assessment of potential modifiable acute ischemic CVA risk factors, including checking hemoglobin A1c, lipid panel, and telemetry monitoring for paroxsymal atrial fibrillation.   Subsequently, the patient was admitted for further evaluation management of presenting acute ischemic stroke.     Review of Systems: As per HPI otherwise 10 point review of systems negative.   Past Medical History:  Diagnosis Date   Stroke [facility])    acute ischemic stroke    History reviewed. No pertinent surgical history.  Social History:  reports that she has never smoked. She does not have any smokeless tobacco history on file. She reports that she does not currently use alcohol. She reports that she does not use drugs.   No Known Allergies   Family history reviewed and not pertinent  Outpatient medications: Patient denies use of any prescription or  over-the-counter medications, including supplements, on an outpatient basis.    Objective    Physical Exam: Vitals:   01/29/22 0000 01/29/22 0410 01/29/22 0415 01/29/22 0430  BP: (!) 189/102 (!) 196/106 (!) 196/106 (!) 181/110  Pulse: 83 73 74 82  Resp: Temp:  98.6 F (37 C)    TempSrc:  Oral    SpO2: 100% 96% 100% 97%    General: appears to be stated age; alert, oriented Skin: warm, dry, no rash Head:  AT/[place] Mouth:  Oral mucosa membranes appear moist, normal dentition Neck: supple; trachea midline Heart:  RRR; did not appreciate any M/R/G Lungs: CTAB, did not appreciate any wheezes, rales, or rhonchi Abdomen: + BS; soft, ND, NT Vascular: 2+ pedal pulses b/l; 2+ radial pulses b/l Extremities: no peripheral edema, no muscle wasting Neuro:  5/5 strength of the proximal and distal flexors and extensors of the upper and lower extremities on right; 2/5 strength in LUE, 4/5 strength in LLE; sensation intact in upper and lower extremities b/l; cranial nerves II through XII grossly intact; no pronator drift; no evidence suggestive of slurred speech, dysarthria, or facial droop; Normal muscle tone. No tremors.    Labs on Admission: I have personally reviewed following labs and imaging studies  CBC: Recent Labs  Lab 01/28/22 2350  WBC 9.1  NEUTROABS 5.6  HGB 13.2  HCT 40.6  MCV 84.6  PLT 218   Basic Metabolic Panel: Recent Labs  Lab 01/28/22 2350  NA 137  K 3.4*  CL 104  CO2 23  GLUCOSE 112*  BUN 9  CREATININE 0.90  CALCIUM 8.8*   GFR: CrCl cannot be calculated (Unknown ideal weight.). Liver Function Tests: Recent Labs  Lab 01/28/22 2350  AST 19  ALT 16  ALKPHOS 100  BILITOT 0.4  PROT 6.5  ALBUMIN 3.3*   No results for input(s): LIPASE, AMYLASE in the last 168 hours. No results for input(s): AMMONIA in the last 168 hours. Coagulation Profile: Recent Labs  Lab 01/28/22 2350  INR 0.9   Cardiac Enzymes: No results for input(s): CKTOTAL,  CKMB, CKMBINDEX, TROPONINI in the last 168 hours. BNP (last 3 results) No results for input(s): PROBNP in the last 8760 hours. HbA1C: Recent Labs    01/29/22 0437  HGBA1C 4.7*   CBG: No results for input(s): GLUCAP in the last 168 hours. Lipid Profile: Recent Labs    01/29/22 0437  CHOL 155  HDL 32*  LDLCALC 91  TRIG 409*  CHOLHDL 4.8   Thyroid Function Tests: No results for input(s): TSH, T4TOTAL, FREET4, T3FREE, THYROIDAB in the last 72 hours. Anemia Panel: No results for input(s): VITAMINB12, FOLATE, FERRITIN, TIBC, IRON, RETICCTPCT in the last 72 hours. Urine analysis:    Component Value Date/Time   COLORURINE YELLOW 01/29/2022 0205   APPEARANCEUR HAZY (A) 01/29/2022 0205   LABSPEC 1.019 01/29/2022 0205   PHURINE 6.0 01/29/2022 0205   GLUCOSEU NEGATIVE 01/29/2022 0205   HGBUR NEGATIVE 01/29/2022 0205   BILIRUBINUR NEGATIVE 01/29/2022 0205   KETONESUR NEGATIVE 01/29/2022 0205   PROTEINUR NEGATIVE 01/29/2022 0205   NITRITE NEGATIVE 01/29/2022 0205   LEUKOCYTESUR TRACE (A) 01/29/2022 0205    Radiological Exams on Admission: CT HEAD WO CONTRAST ( )  Result Date: 01/29/2022 CLINICAL DATA:  Initial evaluation for left-sided weakness for 2 days. EXAM: CT HEAD WITHOUT CONTRAST TECHNIQUE: Contiguous axial images were obtained from the base of the skull through the vertex  without intravenous contrast. RADIATION DOSE REDUCTION: This exam was performed according to the departmental dose-optimization program which includes automated exposure control, adjustment of the mA and/or kV according to patient size and/or use of iterative reconstruction technique. COMPARISON:  Prior head CT from 09/10/2019. FINDINGS: Brain: Cerebral volume within normal limits for age. Chronic encephalomalacia involving the anterior left frontal lobe consistent with a chronic left MCA distribution infarct. Additional chronic left basal ganglia infarct noted. Evolving cytotoxic edema present within the  right parieto-occipital region, consistent with an acute to early subacute right MCA distribution and/or MCA/PCA watershed distribution infarct. Suspected faint petechial blood products without frank hemorrhagic transformation. Localized edema without significant regional mass effect. No other acute large vessel territory infarct or intracranial hemorrhage. No mass lesion or significant midline shift. No hydrocephalus or extra-axial fluid collection. Vascular: No hyperdense vessel. Skull: Scalp soft tissues demonstrate no acute finding. Calvarium intact. Sinuses/Orbits: Globes and orbital soft tissues demonstrate no acute finding. Small right sphenoid sinus retention cyst noted. Paranasal sinuses are otherwise clear. Moderate right with small left mastoid effusions. Other: None. IMPRESSION: 1. Evolving cytotoxic edema within the right parieto-occipital region, consistent with an acute to early subacute right MCA and/or MCA/PCA watershed distribution infarct. Probable associated faint petechial blood products without frank hemorrhagic transformation. Localized edema without significant regional mass effect. 2. No other acute intracranial abnormality. 3. 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 Electronically Signed   By: Rise MuBenjamin  McClintock M.D.   On: 01/29/2022 00:46     EKG: Independently reviewed, with result as described above.    Assessment/Plan   Principal Problem:   Acute ischemic stroke (HCC)       #) Acute ischemic stroke: In the setting of new onset left-sided weakness, worse in the left upper extremity relative to left lower extremity, with last known normal around 2200 on 01/25/2022, with presenting CTh showing findings consistent with acute to early subacute right MCA and/or MCA/PCA distribution infarct without overt evidence of hemorrhagic conversion.   Dr. Thomasena Edisollins of neurology to formally consult. He recommends admission to the Hospitalist service for further evaluation and  management of acute ischemic stroke, including additional imaging in the form of MRI brain as well as MRA head and neck. Additionally, Dr Thomasena Ediscollins  recommends further assessment of potential modifiable acute ischemic CVA risk factors, including checking hemoglobin A1c, lipid panel, and telemetry monitoring for paroxsymal atrial fibrillation.   Of note, the patient as well as side of the window for tPA or thrombectomy.  Additionally, she presents outside the window for observance of permissive hypertension.  Will likely need to be started on oral antihypertensive medications with close monitoring of ensuing blood pressure potential modifiable acute ischemic CVA risk factor.   This is in the context of a reported history of acute ischemic CVA approximately 3 years ago, with patient reporting portable compliance with her outpatient antiplatelet regimen, essentially stating that she has not taking any antiplatelet or statin medications at this time.  Will await neurology recommendations regarding antiplatelet intervention.      Plan: Nursing bedside swallow evaluation x 1 now, and will not initiate oral medications or diet until the patient has passed this. Head of the bed at 30 degrees. Neuro checks per protocol. with prn IV labetalol ordered for systolic blood pressure greater than 180 mmHg or diastolic blood pressure greater than 100 mmHg. Monitor on telemetry, including monitoring for atrial fibrillation as modifiable risk factor for acute ischemic CVA.   MRI brain. MRA head without contrast  and MRA neck with and without contrast.   TTE ordered for the morning.  Check lipid panel and A1c. PT/OT consults ordered for the morning.  Urology formally consulted, with additional recs to follow, including recommendations regarding antiplatelet intervention.  UDS.       DVT prophylaxis: SCD's   Code Status: Full code Family Communication: none Disposition Plan: Per Rounding Team Consults called: Dr. Thomasena Edis  of neurology consulted, as further detailed above.;  Admission status: obs   PLEASE NOTE THAT DRAGON DICTATION SOFTWARE WAS USED IN THE CONSTRUCTION OF THIS NOTE.   Chaney Born Tavarius Grewe DO [facility]  From 7PM - 7AM   01/29/2022, 6:56 AM

## 2022-01-29 NOTE — ED Notes (Signed)
Patient remains in MRI 

## 2022-01-29 NOTE — ED Notes (Signed)
Sister in law Krystal Sherman [phone] would like an update asap ?

## 2022-01-29 NOTE — ED Notes (Signed)
This RN provided update to sister in-law Nene regarding pt status and admission. ?

## 2022-01-29 NOTE — ED Notes (Signed)
RN went into room and and helped pt dial phone number to talk to fiance.  ?

## 2022-01-29 NOTE — Evaluation (Signed)
Physical Therapy Evaluation Patient Details Name: Krystal Sherman MRN: [id number] DOB: [birth year] Today's Date: 01/29/2022  History of Present Illness  Pt is a 41 y/o female presenting on 3/8 with L sided weakness. MRI with extensive acute infarction in R temporoparietal cortex and in superior R frontal cortext, roughly watershed pattern, R occupital cortex infract chronic (but new since last CVA).  PMH includes: CVA with residual R sided weakness, HTN, and polysubstance abuse.   Clinical Impression  Patient presented to the ED on 01/28/22 with a headache, L sided weakness, and double vision consistent with patient's diagnosis of R MCA/PCA infarct. Pt's impairments include decreased cognition, processing, coordination, ROM, strength, balance, and cardiovascular conditioning. These impairments are limiting her ability to safely and independently transfer, ambulate, and navigate stairs. Patient requires close min guard to min A with all functional mobility. She required multimodal cues and increased time for processing cues. Significant weakness in her L knee flexors, L hip flexors, and L hip IR. Pt's BP remained severely high during session so further mobility deferred. Vitals below and RN notified. SPT recommending acute inpatient rehab upon D/C due to mobility deficits and prior level of function. PT will continue to follow acutely.    BP in sitting: 215/140 mmHg BP in standing: 210/138 mmHg   Recommendations for follow up therapy are one component of a multi-disciplinary discharge planning process, led by the attending physician.  Recommendations may be updated based on patient status, additional functional criteria and insurance authorization.  Follow Up Recommendations Acute inpatient rehab (3hours/day)    Assistance Recommended at Discharge Frequent or constant Supervision/Assistance  Patient can return home with the following  A lot of help with walking and/or transfers;Assistance with  cooking/housework;Assistance with feeding;Direct supervision/assist for medications management;Direct supervision/assist for financial management;Assist for transportation;Help with stairs or ramp for entrance;A lot of help with bathing/dressing/bathroom    Equipment Recommendations Rolling walker (2 wheels)  Recommendations for Other Services       Functional Status Assessment Patient has had a recent decline in their functional status and demonstrates the ability to make significant improvements in function in a reasonable and predictable amount of time.     Precautions / Restrictions Precautions Precautions: Fall Precaution Comments: monitor BP Restrictions Weight Bearing Restrictions: No      Mobility  Bed Mobility Overal bed mobility: Needs Assistance Bed Mobility: Supine to Sit, Sit to Supine     Supine to sit: Min guard, HOB elevated Sit to supine: Min guard   General bed mobility comments: Pt required very close min guard for bed mobility tasks for safety and steadying. Pt BP was 215/140 in sitting on EOB. Required cues for LE management and scooting to EOB. She needed min guard A with VC's to bring hips further onto mattress before returning to long sitting in bed. Pt did not process cues quickly; sitting precariously on EOB with close min guard A for safety. Needed cues to scoot her hips laterally in bed.    Transfers Overall transfer level: Needs assistance Equipment used: None Transfers: Sit to/from Stand Sit to Stand: Min assist, From elevated surface           General transfer comment: Pt required needed min A on her L for steadying during transfer. Use UEs to push from mattress to stand.    Ambulation/Gait Ambulation/Gait assistance: Min assist           Pre-gait activities: marches in place General Gait Details: pt able to take side steps at EOB  with min A for steadying. Further mobility deferred due to high BP.  Stairs            Wheelchair  Mobility    Modified Rankin (Stroke Patients Only)       Balance Overall balance assessment: Needs assistance Sitting-balance support: Single extremity supported, Feet unsupported Sitting balance-Leahy Scale: Fair     Standing balance support: No upper extremity supported Standing balance-Leahy Scale: Poor Standing balance comment: increased sway noted to the L with standing requiring min A for steadying. BP taken in standing at 210/138. Further mobility deferred for pt safety due to high BP.                             Pertinent Vitals/Pain Pain Assessment Pain Assessment: Faces Faces Pain Scale: No hurt    Home Living Family/patient expects to be discharged to:: Shelter/Homeless Living Arrangements: Spouse/significant other Available Help at Discharge: Available PRN/intermittently;Other (Comment) (Fiance able to help most of the time) Type of Home: Homeless Home Access: Other (comment) (very steep hill to get to her tent)         Home Equipment: None Additional Comments: lives in tent with fiance.    Prior Function Prior Level of Function : Needs assist       Physical Assist : Mobility (physical) Mobility (physical): Gait   Mobility Comments: SO holds her hand while she goes up and down steep hill to her tent       Hand Dominance   Dominant Hand: Right (can't use it well since last stroke)    Extremity/Trunk Assessment   Upper Extremity Assessment Upper Extremity Assessment: Defer to OT evaluation    Lower Extremity Assessment Lower Extremity Assessment: RLE deficits/detail;LLE deficits/detail;Generalized weakness RLE Deficits / Details: 4/5 MMTs with RLE. Decreased coordination noted with heel to shin test. Slow processing for cues still present. RLE Coordination: decreased gross motor LLE Deficits / Details: Required cues for coordinating LE ROM and strength screen tasks due to slow processing and motor planning. She exhibited significant  ER of the L hip with decreased IR; when asked to raise her LLE in supine, she adbucted and externally rotated her leg with foot sliding on ED mattress (gravity eliminated hip flexion). 2+/5 MMT for LLE hip and knee flexion. 3+/5 MMTs for L ankle DF and PF. Decreased coordination with tasks and the heel to shin test. LLE Coordination: decreased gross motor    Cervical / Trunk Assessment Cervical / Trunk Assessment: Normal  Communication   Communication: Expressive difficulties (mumbles)  Cognition Arousal/Alertness: Lethargic Behavior During Therapy: Flat affect Overall Cognitive Status: No family/caregiver present to determine baseline cognitive functioning                                          General Comments General comments (skin integrity, edema, etc.): Inattention to the L. Had a hard time finding call bell on the L; reaching to the L with L hand but unable to find until turning her head. Required cues and increased time to turn her head and locate call bell. Decreased fine motor skills to push bottom on call bell.    Exercises     Assessment/Plan    PT Assessment Patient needs continued PT services  PT Problem List Decreased strength;Decreased range of motion;Decreased activity tolerance;Decreased balance;Decreased mobility;Decreased coordination;Decreased knowledge of use of  DME;Decreased cognition;Decreased safety awareness;Cardiopulmonary status limiting activity       PT Treatment Interventions DME instruction;Gait training;Functional mobility training;Therapeutic activities;Therapeutic exercise;Balance training;Neuromuscular re-education;Stair training;Cognitive remediation;Patient/family education    PT Goals (Current goals can be found in the Care Plan section)  Acute Rehab PT Goals PT Goal Formulation: Patient unable to participate in goal setting Time For Goal Achievement: 02/12/22 Potential to Achieve Goals: Good    Frequency Min 4X/week      Co-evaluation PT/OT/SLP Co-Evaluation/Treatment: Yes Reason for Co-Treatment: Complexity of the patient's impairments (multi-system involvement);Necessary to address cognition/behavior during functional activity;For patient/therapist safety;To address functional/ADL transfers PT goals addressed during session: Mobility/safety with mobility;Balance;Strengthening/ROM         AM-PAC PT "6 Clicks" Mobility  Outcome Measure Help needed turning from your back to your side while in a flat bed without using bedrails?: A Little Help needed moving from lying on your back to sitting on the side of a flat bed without using bedrails?: A Little Help needed moving to and from a bed to a chair (including a wheelchair)?: A Lot Help needed standing up from a chair using your arms (e.g., wheelchair or bedside chair)?: A Lot Help needed to walk in hospital room?: A Lot Help needed climbing 3-5 steps with a railing? : A Lot 6 Click Score: 14    End of Session Equipment Utilized During Treatment: Gait belt Activity Tolerance: Treatment limited secondary to medical complications (Comment) (BP high) Patient left: in bed;with call bell/phone within reach;Other (comment) (on stretcher in ED) Nurse Communication: Mobility status (high BP) PT Visit Diagnosis: Unsteadiness on feet (R26.81);Muscle weakness (generalized) (M62.81);Other symptoms and signs involving the nervous system (R29.898);Other abnormalities of gait and mobility (R26.89)    Time: 8250-5397 PT Time Calculation (min) (ACUTE ONLY): 25 min   Charges:   PT Evaluation $PT Eval Moderate Complexity: 1 [address] SPT  [place] 01/29/2022, 11:57 AM

## 2022-01-29 NOTE — ED Provider Notes (Signed)
?[name] ?Provider Note ? ? ?CSN: [id number] ?Arrival date & time: 01/28/22  2332 ? ?  ? ?History ? ?Chief Complaint  ?Patient presents with  ? stroke like symptoms  ? ? ?Krystal Sherman is a 41 y.o. female. ? ?The history is provided by the patient and medical records.  ? ?42 year old female with history of stroke, hypertension, substance abuse, presenting to the ED with strokelike symptoms.  States Monday morning she woke up and her entire left side felt weak.  States worse in her left upper extremity.  She continues to have normal sensation in her face arm, and leg.  States when walking she has been dragging her left leg as it "just does not work right".  Also reports some intermittent blurred vision in left eye only with headaches.  She did use meth yesterday.  Reports she has not been taking her home medications as she was having some trouble swallowing.   Patient hypertensive on arrival at 223/124, however states that is fairly normal for her.  She no longer follows with neurology, not taking her ASA and plavix. ? ?Home Medications ?Prior to Admission medications   ?Not on File  ?   ? ?Allergies    ?Patient has no known allergies.   ? ?Review of Systems   ?Review of Systems  ?Neurological:  Positive for weakness.  ?All other systems reviewed and are negative. ? ?Physical Exam ?Updated Vital Signs ?BP (!) 223/124   Pulse 86   Temp 98.1 ?F (36.7 ?C) (Oral)   Resp (!) 22   SpO2 100%  ? ?Physical Exam ?Vitals and nursing note reviewed.  ?Constitutional:   ?   Appearance: She is well-developed.  ?HENT:  ?   Head: Normocephalic and atraumatic.  ?Eyes:  ?   Conjunctiva/sclera: Conjunctivae normal.  ?   Pupils: Pupils are equal, round, and reactive to light.  ?Cardiovascular:  ?   Rate and Rhythm: Normal rate and regular rhythm.  ?   Heart sounds: Normal heart sounds.  ?Pulmonary:  ?   Effort: Pulmonary effort is normal. No respiratory distress.  ?   Breath sounds: Normal  breath sounds. No rhonchi.  ?Abdominal:  ?   General: Bowel sounds are normal.  ?   Palpations: Abdomen is soft.  ?   Tenderness: There is no abdominal tenderness. There is no rebound.  ?Musculoskeletal:     ?   General: Normal range of motion.  ?   Cervical back: Normal range of motion.  ?Skin: ?   General: Skin is warm and dry.  ?Neurological:  ?   Mental Status: She is alert and oriented to person, place, and time.  ?   Comments: AAOx3, answering questions and following commands appropriately; weakness noted on the left, upper > lower extremity; moving right side on command, normal sensation throughout  ? ? ?ED Results / Procedures / Treatments   ?Labs ?(all labs ordered are listed, but only abnormal results are displayed) ?Labs Reviewed  ?COMPREHENSIVE METABOLIC PANEL - Abnormal; Notable for the following components:  ?    Result Value  ? Potassium 3.4 (*)   ? Glucose, Bld 112 (*)   ? Calcium 8.8 (*)   ? Albumin 3.3 (*)   ? All other components within normal limits  ?RAPID URINE DRUG SCREEN, HOSP PERFORMED - Abnormal; Notable for the following components:  ? Amphetamines POSITIVE (*)   ? All other components within normal limits  ?URINALYSIS, ROUTINE W REFLEX  MICROSCOPIC - Abnormal; Notable for the following components:  ? APPearance HAZY (*)   ? Leukocytes,Ua TRACE (*)   ? Bacteria, UA MANY (*)   ? All other components within normal limits  ?HEMOGLOBIN A1C - Abnormal; Notable for the following components:  ? Hgb A1c MFr Bld 4.7 (*)   ? All other components within normal limits  ?RESP PANEL BY RT-PCR (FLU A&B, COVID) ARPGX2  ?CBC WITH DIFFERENTIAL/PLATELET  ?ETHANOL  ?PROTIME-INR  ?HIV ANTIBODY (ROUTINE TESTING W REFLEX)  ?LIPID PANEL  ? ? ?EKG ?None ? ?Radiology ?No results found. ? ?CLINICAL DATA: Initial evaluation for left-sided weakness for 2 ?days. ? ?EXAM: ?CT HEAD WITHOUT CONTRAST ? ?TECHNIQUE: ?Contiguous axial images were obtained from the base of the skull ?through the vertex without intravenous  contrast. ? ?RADIATION DOSE REDUCTION: This exam was performed according to the ?departmental dose-optimization program which includes automated ?exposure control, adjustment of the mA and/or kV according to ?patient size and/or use of iterative reconstruction technique. ? ?COMPARISON: Prior head CT from 09/10/2019. ? ?FINDINGS: ?Brain: Cerebral volume within normal limits for age. Chronic ?encephalomalacia involving the anterior left frontal lobe consistent ?with a chronic left MCA distribution infarct. Additional chronic ?left basal ganglia infarct noted. ? ?Evolving cytotoxic edema present within the right parieto-occipital ?region, consistent with an acute to early subacute right MCA ?distribution and/or MCA/PCA watershed distribution infarct. ?Suspected faint petechial blood products without frank hemorrhagic ?transformation. Localized edema without significant regional mass ?effect. ? ?No other acute large vessel territory infarct or intracranial ?hemorrhage. No mass lesion or significant midline shift. No ?hydrocephalus or extra-axial fluid collection. ? ?Vascular: No hyperdense vessel. ? ?Skull: Scalp soft tissues demonstrate no acute finding. Calvarium ?intact. ? ?Sinuses/Orbits: Globes and orbital soft tissues demonstrate no acute ?finding. Small right sphenoid sinus retention cyst noted. Paranasal ?sinuses are otherwise clear. Moderate right with small left mastoid ?effusions. ? ?Other: None. ? ?IMPRESSION: ?1. Evolving cytotoxic edema within the right parieto-occipital ?region, consistent with an acute to early subacute right MCA and/or ?MCA/PCA watershed distribution infarct. Probable associated faint ?petechial blood products without frank hemorrhagic transformation. ?Localized edema without significant regional mass effect. ?2. No other acute intracranial abnormality. ?3. 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 ? ?Electronically Signed ?By: Jeannine Boga M.D. ?On: 01/29/2022 00:46   ? ?Procedures ?Procedures  ? ? ?Medications Ordered in ED ?Medications - No data to display ? ?ED Course/ Medical Decision Making/ A&P ?  ?                        ?Medical Decision Making ?Amount and/or Complexity of Data Reviewed ?External Data Reviewed: labs and notes. ?Labs: ordered. ?Radiology: ordered and independent interpretation performed. ?ECG/medicine tests: ordered and independent interpretation performed. ? ?Risk ?Decision regarding hospitalization. ? ? ?41 y.o. F presenting to the ED for left sided weakness since Monday morning upon waking.  Prior stroke 3 years ago, not currently on ASA or plavix and has been lost to neurology follow-up.  She is AAOx3 here, able to answer questions appropriately.  Does have noted weakness LUE > LLE.  Normal sensation throughout.  She is fairly hypertensive, states that is baseline for her though.  Not compliant with medications currently.  Concern for likely stroke.  Will get CT head, labs, covid screen. ? ?CT reviewed--  findings of early/subacute right MCA/PCA infarct.  Some petechial blood without frank hemorrhage.  Labs are overall reassuring.  Discussed with on call neurology, Dr. Theda Sers-- will evaluate  and provide recommendations.  Will admit to medicine service. ? ?Discussed with hospitalist, Dr. Velia Meyer-- will admit for ongoing care. ? ?Final Clinical Impression(s) / ED Diagnoses ?Final diagnoses:  ?Cerebrovascular accident (CVA), unspecified mechanism ([place])  ? ? ?Rx / DC Orders ?ED Discharge Orders   ? ? None  ? ?  ? ? ?  ?Larene Pickett, PA-C ?01/29/22 [id number] ? ?  ?Lianne Cure, DO ?[id number] ? ?

## 2022-01-29 NOTE — ED Notes (Signed)
Walked patient to the bathroom patient did well patient is now back in the room on the monitor with call bell in reach changed patient bed sheets  ?

## 2022-01-29 NOTE — ED Notes (Signed)
Returned from MRI 

## 2022-01-29 NOTE — Progress Notes (Addendum)
STROKE TEAM PROGRESS NOTE  ° °INTERVAL HISTORY °Patient is seen in her room with no family at the bedside. She states that yesterday after awakening, her left arm and leg were weak.  She called 911 and presented to the ED.  MRI shows infarction in the right temporparietal cortex and superior right frontal cortex.  Patient has a history of uncontrolled HTN and states that she does not have a PCP at this time.  She also has a history of cocaine and methamphetamine use and would like assistance in substance use cessation. ° °Vitals:  ° 01/29/22 1000 01/29/22 1030 01/29/22 1100 01/29/22 1130  °BP: (!) 219/140 (!) 224/137 (!) 210/113 (!) 237/144  °Pulse: 82 87 83 80  °Resp: 20 (!) 21 20 (!) 21  °Temp:      °TempSrc:      °SpO2: 98% 100% 100% 99%  ° °CBC:  °Recent Labs  °Lab 01/28/22 °2350 01/29/22 °0915  °WBC 9.1 7.8  °NEUTROABS 5.6  --   °HGB 13.2 13.3  °HCT 40.6 40.7  °MCV 84.6 85.3  °PLT 218 229  ° °Basic Metabolic Panel:  °Recent Labs  °Lab 01/28/22 °2350 01/29/22 °0915  °NA 137 135  °K 3.4* 3.7  °CL 104 103  °CO2 23 24  °GLUCOSE 112* 149*  °BUN 9 9  °CREATININE 0.90 0.86  °CALCIUM 8.8* 8.7*  ° °Lipid Panel:  °Recent Labs  °Lab 01/29/22 °0437  °CHOL 155  °TRIG 160*  °HDL 32*  °CHOLHDL 4.8  °VLDL 32  °LDLCALC 91  ° °HgbA1c:  °Recent Labs  °Lab 01/29/22 °0437  °HGBA1C 4.7*  ° °Urine Drug Screen:  °Recent Labs  °Lab 01/29/22 °0204  °LABOPIA NONE DETECTED  °COCAINSCRNUR NONE DETECTED  °LABBENZ NONE DETECTED  °AMPHETMU POSITIVE*  °THCU NONE DETECTED  °LABBARB NONE DETECTED  °  °Alcohol Level  °Recent Labs  °Lab 01/28/22 °2350  °ETH <10  ° ° °IMAGING past 24 hours °CT ANGIO HEAD NECK W WO CM ° °Result Date: 01/29/2022 °CLINICAL DATA:  Stroke follow-up.  Left-sided weakness EXAM: CT ANGIOGRAPHY HEAD AND NECK TECHNIQUE: Multidetector CT imaging of the head and neck was performed using the standard protocol during bolus administration of intravenous contrast. Multiplanar CT image reconstructions and MIPs were obtained to  evaluate the vascular anatomy. Carotid stenosis measurements (when applicable) are obtained utilizing NASCET criteria, using the distal internal carotid diameter as the denominator. RADIATION DOSE REDUCTION: This exam was performed according to the departmental dose-optimization program which includes automated exposure control, adjustment of the mA and/or kV according to patient size and/or use of iterative reconstruction technique. CONTRAST:  100mL OMNIPAQUE IOHEXOL 350 MG/ML SOLN COMPARISON:  CT head and MRI head 01/29/2022 FINDINGS: CT HEAD FINDINGS Brain: Hypodensity right parietal lobe unchanged compatible with acute infarct. No associated hemorrhage. Chronic infarct right occipital lobe unchanged. Chronic infarct left basal ganglia unchanged. Chronic infarct left frontal parietal lobe unchanged. Ventricle size normal.  No acute hemorrhage. Vascular: Negative for hyperdense vessel Skull: Negative Sinuses: Mild mucosal edema paranasal sinuses. Orbits: Negative Review of the MIP images confirms the above findings CTA NECK FINDINGS Aortic arch: Standard branching. Imaged portion shows no evidence of aneurysm or dissection. No significant stenosis of the major arch vessel origins. Right carotid system: Right carotid bifurcation normal. Negative for atherosclerotic disease or dissection. Tortuous right internal carotid artery Left carotid system: Left carotid bifurcation normal. No atherosclerotic disease or dissection. Tortuous left internal carotid artery. Vertebral arteries: Normal vertebral arteries bilaterally. No stenosis or dissection. Skeleton: Incomplete   segmentation of C6 and C7, congenital. No acute skeletal abnormality. Other neck: Negative for mass or adenopathy in the neck. Upper chest: Lung apices clear bilaterally. Review of the MIP images confirms the above findings CTA HEAD FINDINGS Anterior circulation: Irregularity and diffuse narrowing of the cavernous carotid bilaterally. Scattered small  calcifications. Moderate stenosis right cavernous and supraclinoid internal carotid artery. Mild stenosis left cavernous and supraclinoid internal carotid artery. Anterior cerebral artery patent bilaterally without stenosis. Right M1 segment patent. Relatively small right middle cerebral artery without stenosis or large vessel occlusion. Segmental occlusion left M1 segment. There are collaterals in the area with decreased opacification of left MCA branches due to collaterals flow. Posterior circulation: Both vertebral arteries patent to the basilar. PICA patent bilaterally. Basilar patent. Fetal origin left posterior cerebral artery. Both posterior cerebral arteries are patent. Mild stenosis right PCA. No aneurysm. Venous sinuses: Normal venous enhancement Anatomic variants: None Review of the MIP images confirms the above findings IMPRESSION: 1. Moderately large acute infarct right parietal lobe unchanged. No acute hemorrhage. 2. Chronic infarcts in the right occipital lobe, left frontal lobe, left basal ganglia unchanged. Advanced cerebrovascular disease for age. 3. Carotid artery and vertebral artery normal in the neck. No significant atherosclerotic disease in the neck. 4. Irregular stenosis in the cavernous carotid bilaterally, right greater than left. Several small calcifications are present in the cavernous carotid bilaterally 5. Segmental occlusion left M1 segment. Decreased flow in the left MCA branches due to collateral flow. 6. 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 Intracranial vasculitis possible. Intracranial atherosclerotic disease possible however lack of atherosclerotic disease in the neck and the patient's age reduce the likelihood of atherosclerotic disease. Electronically Signed   By: Charles  Clark M.D.   On: 01/29/2022 09:16  ° °CT HEAD WO CONTRAST (5MM) ° °Result Date: 01/29/2022 °CLINICAL DATA:  Initial evaluation for left-sided weakness for 2 days. EXAM: CT HEAD WITHOUT CONTRAST TECHNIQUE: Contiguous  axial images were obtained from the base of the skull through the vertex without intravenous contrast. RADIATION DOSE REDUCTION: This exam was performed according to the departmental dose-optimization program which includes automated exposure control, adjustment of the mA and/or kV according to patient size and/or use of iterative reconstruction technique. COMPARISON:  Prior head CT from 09/10/2019. FINDINGS: Brain: Cerebral volume within normal limits for age. Chronic encephalomalacia involving the anterior left frontal lobe consistent with a chronic left MCA distribution infarct. Additional chronic left basal ganglia infarct noted. Evolving cytotoxic edema present within the right parieto-occipital region, consistent with an acute to early subacute right MCA distribution and/or MCA/PCA watershed distribution infarct. Suspected faint petechial blood products without frank hemorrhagic transformation. Localized edema without significant regional mass effect. No other acute large vessel territory infarct or intracranial hemorrhage. No mass lesion or significant midline shift. No hydrocephalus or extra-axial fluid collection. Vascular: No hyperdense vessel. Skull: Scalp soft tissues demonstrate no acute finding. Calvarium intact. Sinuses/Orbits: Globes and orbital soft tissues demonstrate no acute finding. Small right sphenoid sinus retention cyst noted. Paranasal sinuses are otherwise clear. Moderate right with small left mastoid effusions. Other: None. IMPRESSION: 1. Evolving cytotoxic edema within the right parieto-occipital region, consistent with an acute to early subacute right MCA and/or MCA/PCA watershed distribution infarct. Probable associated faint petechial blood products without frank hemorrhagic transformation. Localized edema without significant regional mass effect. 2. No other acute intracranial abnormality. 3. 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 Electronically Signed   By: Benjamin   McClintock M.D.   On: 01/29/2022 00:46  ° °MR ANGIO HEAD   WO CONTRAST ° °Result Date: 01/29/2022 °CLINICAL DATA:  Stroke-like symptoms. Left-sided weakness since Monday morning EXAM: MRI HEAD WITHOUT CONTRAST MRA HEAD WITHOUT CONTRAST MRA OF THE NECK WITHOUT AND WITH CONTRAST TECHNIQUE: Multiplanar, multi-echo pulse sequences of the brain and surrounding structures were acquired without intravenous contrast. Angiographic images of the Circle of Willis were acquired using MRA technique without intravenous contrast. Angiographic images of the neck were acquired using MRA technique without and with intravenous contrast. Carotid stenosis measurements (when applicable) are obtained utilizing NASCET criteria, using the distal internal carotid diameter as the denominator. CONTRAST:  7.5mL GADAVIST GADOBUTROL 1 MMOL/ML IV SOLN COMPARISON:  Head CT from earlier the same day FINDINGS: MR HEAD FINDINGS Brain: Extensive primarily cortically based right cerebral infarction affecting the temporoparietal cortex and extending superiorly along the superior right frontal cortex. Mild T1 hyperintensity at the infarct compatible with petechial blood products when correlated with CT. Pre-existing right occipital and left superior frontal cortex infarcts. Chronic perforator infarct at the left basal ganglia. No hematoma, hydrocephalus, or herniation. Pervasive motion artifact. Chronic blood products at the remote perforator infarct on the left. Vascular: See below Skull and upper cervical spine: No focal marrow lesion Sinuses/Orbits: Negative Other: Motion artifact. MRA HEAD FINDINGS Anterior circulation: Irregular appearance of the carotid siphons with flow gap at the right supraclinoid segment. No flow seen at the left M1 or A1 segments with diminutive downstream signal. Reportedly there was a normal MRA in 2019 at [facility]. Posterior circulation: Patent vertebral and basilar arteries. Fetal type left PCA flow. Bilateral PCA narrowing,  particularly high-grade at the right P1 and P3 segments. Anatomic variants:As above. MRA NECK FINDINGS Aortic arch: Unremarkable with 3 vessel branching Right carotid system: Tortuous ICA. No evidence of stenosis or dissection. Left carotid system: Tortuous ICA. No visible stenosis or dissection. Vertebral arteries: The vertebral arteries are smoothly contoured and diffusely patent. IMPRESSION: Brain MRI: 1. Extensive acute infarction in the right temporoparietal cortex and in the superior right frontal cortex, roughly watershed pattern. 2. Right occipital cortex infarct which is chronic but has occurred since May 2022 comparison. More remote left superior frontal infarct. 3. Motion degraded Intracranial MRA: 1. Degraded by motion. 2. Advanced intracranial stenoses with flow gap at the right supraclinoid ICA and left carotid terminus/A1/M1 segments. Question moyamoya disease. Neck MRA: Negative motion degraded assessment. Electronically Signed   By: Jonathan  Watts M.D.   On: 01/29/2022 07:12  ° °MR ANGIO NECK W WO CONTRAST ° °Result Date: 01/29/2022 °CLINICAL DATA:  Stroke-like symptoms. Left-sided weakness since Monday morning EXAM: MRI HEAD WITHOUT CONTRAST MRA HEAD WITHOUT CONTRAST MRA OF THE NECK WITHOUT AND WITH CONTRAST TECHNIQUE: Multiplanar, multi-echo pulse sequences of the brain and surrounding structures were acquired without intravenous contrast. Angiographic images of the Circle of Willis were acquired using MRA technique without intravenous contrast. Angiographic images of the neck were acquired using MRA technique without and with intravenous contrast. Carotid stenosis measurements (when applicable) are obtained utilizing NASCET criteria, using the distal internal carotid diameter as the denominator. CONTRAST:  7.5mL GADAVIST GADOBUTROL 1 MMOL/ML IV SOLN COMPARISON:  Head CT from earlier the same day FINDINGS: MR HEAD FINDINGS Brain: Extensive primarily cortically based right cerebral infarction  affecting the temporoparietal cortex and extending superiorly along the superior right frontal cortex. Mild T1 hyperintensity at the infarct compatible with petechial blood products when correlated with CT. Pre-existing right occipital and left superior frontal cortex infarcts. Chronic perforator infarct at the left basal ganglia. No hematoma, hydrocephalus, or herniation. Pervasive   motion artifact. Chronic blood products at the remote perforator infarct on the left. Vascular: See below Skull and upper cervical spine: No focal marrow lesion Sinuses/Orbits: Negative Other: Motion artifact. MRA HEAD FINDINGS Anterior circulation: Irregular appearance of the carotid siphons with flow gap at the right supraclinoid segment. No flow seen at the left M1 or A1 segments with diminutive downstream signal. Reportedly there was a normal MRA in 2019 at [facility]. Posterior circulation: Patent vertebral and basilar arteries. Fetal type left PCA flow. Bilateral PCA narrowing, particularly high-grade at the right P1 and P3 segments. Anatomic variants:As above. MRA NECK FINDINGS Aortic arch: Unremarkable with 3 vessel branching Right carotid system: Tortuous ICA. No evidence of stenosis or dissection. Left carotid system: Tortuous ICA. No visible stenosis or dissection. Vertebral arteries: The vertebral arteries are smoothly contoured and diffusely patent. IMPRESSION: Brain MRI: 1. Extensive acute infarction in the right temporoparietal cortex and in the superior right frontal cortex, roughly watershed pattern. 2. Right occipital cortex infarct which is chronic but has occurred since May 2022 comparison. More remote left superior frontal infarct. 3. Motion degraded Intracranial MRA: 1. Degraded by motion. 2. Advanced intracranial stenoses with flow gap at the right supraclinoid ICA and left carotid terminus/A1/M1 segments. Question moyamoya disease. Neck MRA: Negative motion degraded assessment. Electronically Signed   By: Jonathan  Watts  M.D.   On: 01/29/2022 07:12  ° °MR BRAIN WO CONTRAST ° °Result Date: 01/29/2022 °CLINICAL DATA:  Stroke-like symptoms. Left-sided weakness since Monday morning EXAM: MRI HEAD WITHOUT CONTRAST MRA HEAD WITHOUT CONTRAST MRA OF THE NECK WITHOUT AND WITH CONTRAST TECHNIQUE: Multiplanar, multi-echo pulse sequences of the brain and surrounding structures were acquired without intravenous contrast. Angiographic images of the Circle of Willis were acquired using MRA technique without intravenous contrast. Angiographic images of the neck were acquired using MRA technique without and with intravenous contrast. Carotid stenosis measurements (when applicable) are obtained utilizing NASCET criteria, using the distal internal carotid diameter as the denominator. CONTRAST:  7.5mL GADAVIST GADOBUTROL 1 MMOL/ML IV SOLN COMPARISON:  Head CT from earlier the same day FINDINGS: MR HEAD FINDINGS Brain: Extensive primarily cortically based right cerebral infarction affecting the temporoparietal cortex and extending superiorly along the superior right frontal cortex. Mild T1 hyperintensity at the infarct compatible with petechial blood products when correlated with CT. Pre-existing right occipital and left superior frontal cortex infarcts. Chronic perforator infarct at the left basal ganglia. No hematoma, hydrocephalus, or herniation. Pervasive motion artifact. Chronic blood products at the remote perforator infarct on the left. Vascular: See below Skull and upper cervical spine: No focal marrow lesion Sinuses/Orbits: Negative Other: Motion artifact. MRA HEAD FINDINGS Anterior circulation: Irregular appearance of the carotid siphons with flow gap at the right supraclinoid segment. No flow seen at the left M1 or A1 segments with diminutive downstream signal. Reportedly there was a normal MRA in 2019 at [facility]. Posterior circulation: Patent vertebral and basilar arteries. Fetal type left PCA flow. Bilateral PCA narrowing, particularly high-grade  at the right P1 and P3 segments. Anatomic variants:As above. MRA NECK FINDINGS Aortic arch: Unremarkable with 3 vessel branching Right carotid system: Tortuous ICA. No evidence of stenosis or dissection. Left carotid system: Tortuous ICA. No visible stenosis or dissection. Vertebral arteries: The vertebral arteries are smoothly contoured and diffusely patent. IMPRESSION: Brain MRI: 1. Extensive acute infarction in the right temporoparietal cortex and in the superior right frontal cortex, roughly watershed pattern. 2. Right occipital cortex infarct which is chronic but has occurred since May 2022 comparison. More remote   left superior frontal infarct. 3. Motion degraded Intracranial MRA: 1. Degraded by motion. 2. Advanced intracranial stenoses with flow gap at the right supraclinoid ICA and left carotid terminus/A1/M1 segments. Question moyamoya disease. Neck MRA: Negative motion degraded assessment. Electronically Signed   By: Jonathan  Watts M.D.   On: 01/29/2022 07:12   ° °PHYSICAL EXAM °General:  Alert, obese patient, tearful ° ° °NEURO:  °Mental Status: AA&Ox3  °Speech/Language: speech is without dysarthria or aphasia.  Naming, repetition, fluency, and comprehension intact. ° °Cranial Nerves:  °II: PERRL. Visual fields full.  °III, IV, VI: EOMI. Eyelids elevate symmetrically.  °V: Sensation is intact to light touch and symmetrical to face.  °VII: Smile is symmetrical. . °IX, X: Phonation is normal.  °XII: tongue is midline without fasciculations. °Motor: 5/5 strength to RUE, RLE and LLE. 4/5 strength to LUE  °Tone: is normal and bulk is normal °Sensation- Intact to light touch bilaterally.  °Coordination: FTN with mild ataxia bilaterally, HKS: no ataxia in BLE. Drift in LUE  °Gait- deferred ° ° °ASSESSMENT/PLAN °Ms. Carisma M Whidbee is a 40 y.o. female with history of HTN and substance abuse presenting with left arm and leg weakness which occurred yesterday after awakening.  She called 911 and presented to the ED.   MRI shows infarction in the right temporparietal cortex and superior right frontal cortex.  Patient has a history of uncontrolled HTN and states that she does not have a PCP at this time.  She also has a history of cocaine and methamphetamine use.  Discussed need for better control of hypertension and cessation of substance abuse with patient.  She states that she is willing to establish care with a PCP, take medications for her hypertension and that she would like assistance with substance use cessation. ° °Stroke:  right MCA scattered infarct due to right ICA siphon stenosis likely secondary large vessel source in the setting of uncontrolled hypertension and stimulant abuse °CT head Evolving cytotoxic edema in right parieto-occipital region consistent with acute right MCA or MCA/PCA infarct, chronic left MCA and basal ganglia infarcts °CTA head & neck irregular stenosis in bilateral cavernous carotids right more than left, segmental occlusion of left M1 segment, consider moyamoya disease °MRI  extensive infarct in right temporparietal cortex and superior right frontal cortex °MRA  advanced intracranial stenosis with flow gap at right supraclinoid ICA and left carotid terminus °2D Echo EF 55-60% °TEE pending °If PFO present, may need LE venous Doppler to rule out DVT. °LDL 91 °HgbA1c 4.7 °UDS positive for amphetamine  °Hypercoagulable and autoimmune work up pending °VTE prophylaxis - lovenox °No antithrombotic prior to admission, now on aspirin 81 mg daily and clopidogrel 75 mg daily.  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 °On DAPT and statin ° °Hx  of stroke °04/2018 presented to ED for right sided weakness and numbness for 2 weeks as well as aphasia. MRi showed left frontal infarcts. MRA head and neck unremarkable. CT negative. Discharged with ASA and lipitor 80.  °Pt did not follow up with physician and not compliant with meds ° °Hypertension, uncontrolled °Home meds:  none °Unstable, requiring IV hydralazine and labetalol °Permissive hypertension (OK if < 220/120) but gradually   normalize in 3-5 days °Long-term BP goal normotensive ° °Hyperlipidemia °Home meds:  none °LDL 91, goal < 70 °Add atorvastatin 40 mg  °Continue statin at discharge ° °Polysubstance abuse °Patient has history of methamphetamine and cocaine use °UDS positive for amphetamine °Patient is willing to quit °TOC consult placed for cessation assistance ° °Tobacco abuse °Current smoker °Smoking cessation counseling will be provided ° °Other Stroke Risk Factors °Obesity, BMI >/= 30 associated with increased stroke risk, recommend weight loss, diet and exercise as appropriate  °Family hx stroke  ° °Other Active Problems ° ° °Hospital day # 0 ° °Krystal Sherman , MSN, AGACNP-BC °[facility] °See Amion for schedule and pager information °01/29/2022 12:23 PM ° °ATTENDING NOTE: °I reviewed above note and agree with the assessment and plan. Pt was seen and examined.  ° °40-year-old female with history of substance abuse, uncontrolled HTN, stroke, smoker admitted for left sided weakness, left blurry vision, and HA. BP 223/124. CT showed right parietooccipital acute infarcts, however, old left frontal, left BG, and right PCA old infarcts. MRI showed right MCA scattered infarcts. EF 55-60%. TEE pending. CTA irregular stenosis in bilateral cavernous carotids, right more than left, segmental occlusion of left M1 segment, consider moyamoya disease. LDL 91, A1C 4.7. UDS positive for amphetamine.  Hypercoagulable and autoimmune work-up pending. ° °Patient had a stroke in 04/2018 presented to ED  for right sided weakness and numbness for 2 weeks as well as aphasia. MRi showed left frontal infarcts. MRA head and neck unremarkable. CT negative. Discharged with ASA and lipitor 80. Pt did not follow up with physician and not compliant with meds ° °On exam, no family at the bedside, pt drowsy sleepy but able to answer questions with stimulation and following commands. orientated to age, place, time. No aphasia but mild dysarthria, paucity of speech, able to name and repeat. No gaze palsy, tracking bilaterally, left hemianopia. Mild left facial droop. Tongue midline. RUE and RLE 5/5, no drift. However, LUE and LLE 4/5 with mild drift. Sensation symmetrical bilaterally, right FTN intact, left FTN ataxic but not out of proportion to weakness, gait not tested.  ° °Etiology for patient symptoms likely due to intracranial stenosis which secondary to multiple uncontrolled risk factors, including uncontrolled hypertensive emergency, smoking, drug use, obesity.  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  BP still on the high and however, agree with permissive hypertension for 24 to 48 hours.  Now on aspirin 81 and Plavix 75 DAPT, once BP under better control, consider aspirin 325 and Plavix 75 DAPT for 3 months and then aspirin alone.  On Lipitor 40.  Aggressive risk factor modification including better BP control, quit smoking, quit substance, lifestyle changes.  PT/OT recommend CIR.  Pending TEE tomorrow.  We will follow ° °For detailed assessment and plan, please refer to above as I have made changes wherever appropriate.  ° °Krystal Bardon, MD PhD °Stroke Neurology °01/29/2022 °7:17 PM ° ° °  ° °To contact Stroke Continuity provider, please refer to [email or web address]. °After hours, contact General Neurology  °

## 2022-01-30 ENCOUNTER — Inpatient Hospital Stay (HOSPITAL_COMMUNITY): Payer: Medicare Other

## 2022-01-30 ENCOUNTER — Inpatient Hospital Stay (HOSPITAL_COMMUNITY): Payer: Medicare Other | Admitting: Anesthesiology

## 2022-01-30 ENCOUNTER — Encounter (HOSPITAL_COMMUNITY)
Admission: EM | Disposition: A | Discharge status: Home / Self Care | Payer: Self-pay | Source: Home / Self Care | Attending: Internal Medicine

## 2022-01-30 DIAGNOSIS — I639 Cerebral infarction, unspecified: Secondary | ICD-10-CM

## 2022-01-30 DIAGNOSIS — I10 Essential (primary) hypertension: Secondary | ICD-10-CM

## 2022-01-30 DIAGNOSIS — I675 Moyamoya disease: Secondary | ICD-10-CM

## 2022-01-30 DIAGNOSIS — F1910 Other psychoactive substance abuse, uncomplicated: Secondary | ICD-10-CM | POA: Diagnosis not present

## 2022-01-30 DIAGNOSIS — Q2112 Patent foramen ovale: Secondary | ICD-10-CM

## 2022-01-30 DIAGNOSIS — F17200 Nicotine dependence, unspecified, uncomplicated: Secondary | ICD-10-CM | POA: Diagnosis not present

## 2022-01-30 HISTORY — PX: TEE WITHOUT CARDIOVERSION: SHX5443

## 2022-01-30 HISTORY — PX: BUBBLE STUDY: SHX6837

## 2022-01-30 LAB — LUPUS ANTICOAGULANT PANEL
DRVVT: 30.3 s (ref 0.0–47.0)
PTT Lupus Anticoagulant: 33.3 s (ref 0.0–43.5)

## 2022-01-30 LAB — CARDIOLIPIN ANTIBODIES, IGG, IGM, IGA
Anticardiolipin IgA: <9 APL U/mL (ref 0–11)
Anticardiolipin IgG: <9 GPL U/mL (ref 0–14)
Anticardiolipin IgM: <9 MPL U/mL (ref 0–12)

## 2022-01-30 LAB — PROTEIN C ACTIVITY: Protein C Activity: 86 % (ref 73–180)

## 2022-01-30 LAB — PROTEIN C, TOTAL: Protein C, Total: 80 % (ref 60–150)

## 2022-01-30 LAB — HOMOCYSTEINE: Homocysteine: 13.5 umol/L (ref 0.0–14.5)

## 2022-01-30 LAB — PROTEIN S, TOTAL: Protein S Ag, Total: 74 % (ref 60–150)

## 2022-01-30 LAB — PROTEIN S ACTIVITY: Protein S Activity: 41 % — ABNORMAL LOW (ref 63–140)

## 2022-01-30 LAB — BETA-2-GLYCOPROTEIN I ABS, IGG/M/A
Beta-2 Glyco I IgG: <9 GPI IgG units (ref 0–20)
Beta-2-Glycoprotein I IgA: <9 GPI IgA units (ref 0–25)
Beta-2-Glycoprotein I IgM: <9 GPI IgM units (ref 0–32)

## 2022-01-30 SURGERY — ECHOCARDIOGRAM, TRANSESOPHAGEAL
Anesthesia: Monitor Anesthesia Care

## 2022-01-30 MED ORDER — PROPOFOL 10 MG/ML IV BOLUS
INTRAVENOUS | Status: DC | PRN
  Administered 2022-01-30: 50 mg via INTRAVENOUS
  Administered 2022-01-30 (×2): 20 mg via INTRAVENOUS
  Administered 2022-01-30: 30 mg via INTRAVENOUS
  Administered 2022-01-30: 50 mg via INTRAVENOUS
  Administered 2022-01-30: 30 mg via INTRAVENOUS

## 2022-01-30 MED ORDER — LIDOCAINE 2% (20 MG/ML) 5 ML SYRINGE
INTRAMUSCULAR | Status: DC | PRN
  Administered 2022-01-30: 60 mg via INTRAVENOUS

## 2022-01-30 MED ORDER — CYANOCOBALAMIN 1000 MCG/ML IJ SOLN
1000.0000 ug | Freq: Every day | INTRAMUSCULAR | Status: AC
  Administered 2022-01-30 – 2022-02-05 (×7): 1000 ug via SUBCUTANEOUS
  Filled 2022-01-30 (×7): qty 1

## 2022-01-30 MED ORDER — LACTATED RINGERS IV SOLN: INTRAVENOUS | Status: DC | PRN

## 2022-01-30 MED ORDER — PROPOFOL 500 MG/50ML IV EMUL
INTRAVENOUS | Status: DC | PRN
  Administered 2022-01-30: 75 ug/kg/min via INTRAVENOUS

## 2022-01-30 MED ORDER — AMLODIPINE BESYLATE 5 MG PO TABS
5.0000 mg | ORAL_TABLET | Freq: Every day | ORAL | Status: DC
  Administered 2022-01-30 – 2022-01-31 (×2): 5 mg via ORAL
  Filled 2022-01-30 (×2): qty 1

## 2022-01-30 MED ORDER — CLONAZEPAM 0.5 MG PO TABS
0.5000 mg | ORAL_TABLET | Freq: Three times a day (TID) | ORAL | Status: DC | PRN
  Administered 2022-01-30 – 2022-01-31 (×2): 0.5 mg via ORAL
  Filled 2022-01-30 (×2): qty 1

## 2022-01-30 NOTE — Plan of Care (Signed)

## 2022-01-30 NOTE — TOC CAGE-AID Note (Signed)
Transition of Care (TOC) - CAGE-AID Screening ? ? ?Patient Details  ?Name: Krystal Sherman ?MRN: [id number] ?Date of Birth: Feb 05, 1981 ? ?Transition of Care (TOC) CM/SW Contact:    ?Cage Gupton C Tarpley-Carter, LCSWA ?Phone Number: ?01/30/2022, 10:40 AM ? ? ?Clinical Narrative: ?Pt is unable to participate in Cage Aid. ?Pt currently has poor awareness.  CSW will assess at a better time. ? ?Passenger transport manager, MSW, LCSW-A ?Pronouns:  She/Her/Hers ?[facility] of [facility] ?Clinical Social Worker ?Direct Number:  [phone] ?[email or web address] ? ?CAGE-AID Screening: ?Substance Abuse Screening unable to be completed due to: : Patient unable to participate (Pt currently has poor awareness.) ? ?  ?  ?  ?  ?  ? ?Substance Abuse Education Offered: Yes ? ?Substance abuse interventions: Educational Materials ? ? ? ? ? ? ?

## 2022-01-30 NOTE — Anesthesia Preprocedure Evaluation (Signed)
Anesthesia Evaluation  ?Patient identified by MRN, date of birth, ID band ?Patient awake ? ? ? ?Reviewed: ?Allergy & Precautions, NPO status , Patient's Chart, lab work & pertinent test results ? ?History of Anesthesia Complications ?Negative for: history of anesthetic complications ? ?Airway ?Mallampati: II ? ?TM Distance: >3 FB ?Neck ROM: Full ? ? ? Dental ?  ?Pulmonary ?neg pulmonary ROS,  ?  ?Pulmonary exam normal ? ? ? ? ? ? ? Cardiovascular ?hypertension, Normal cardiovascular exam ? ? ?  ?Neuro/Psych ?CVA   ? GI/Hepatic ?negative GI ROS, (+)  ?  ? substance abuse ? cocaine use and methamphetamine use,   ?Endo/Other  ?negative endocrine ROS ? Renal/GU ?negative Renal ROS  ?negative genitourinary ?  ?Musculoskeletal ?negative musculoskeletal ROS ?(+)  ? Abdominal ?  ?Peds ? Hematology ?negative hematology ROS ?(+)   ?Anesthesia Other Findings ? ? Reproductive/Obstetrics ? ?  ? ? ? ? ? ? ? ? ? ? ? ? ? ?  ?  ? ? ? ? ? ? ? ? ?Anesthesia Physical ?Anesthesia Plan ? ?ASA: 4 ? ?Anesthesia Plan: MAC  ? ?Post-op Pain Management: Minimal or no pain anticipated  ? ?Induction: Intravenous ? ?PONV Risk Score and Plan: 2 and Propofol infusion, TIVA and Treatment may vary due to age or medical condition ? ?Airway Management Planned: Natural Airway, Nasal Cannula and Simple Face Mask ? ?Additional Equipment: None ? ?Intra-op Plan:  ? ?Post-operative Plan:  ? ?Informed Consent: I have reviewed the patients History and Physical, chart, labs and discussed the procedure including the risks, benefits and alternatives for the proposed anesthesia with the patient or authorized representative who has indicated his/her understanding and acceptance.  ? ? ? ? ? ?Plan Discussed with:  ? ?Anesthesia Plan Comments:   ? ? ? ? ? ? ?Anesthesia Quick Evaluation ? ?

## 2022-01-30 NOTE — CV Procedure (Signed)
? ? ? ?  TRANSESOPHAGEAL ECHOCARDIOGRAM  ? ?NAME:  Krystal Sherman   MRN: [id number] ?DOB:  Mar 25, 1981   ADMIT DATE: 01/28/2022 ? ?INDICATIONS: ?CVA ? ?PROCEDURE:  ? ?Informed consent was obtained prior to the procedure. The risks, benefits and alternatives for the procedure were discussed and the patient comprehended these risks.  Risks include, but are not limited to, cough, sore throat, vomiting, nausea, somnolence, esophageal and stomach trauma or perforation, bleeding, low blood pressure, aspiration, pneumonia, infection, trauma to the teeth and death.   ? ?After a procedural time-out, the oropharynx was anesthetized and the patient was sedated by the anesthesia service. The transesophageal probe was inserted in the esophagus and stomach without difficulty and multiple views were obtained. Anesthesia was monitored by Lillette Boxer, CRNA.  ? ? ?COMPLICATIONS:   ? ?There were no immediate complications. ? ?FINDINGS: ? ?Positive bubble study consistent with PFO ? ?Epifanio Lesches MD ?[facility]  ?[address] ?[place], [place] [address] ?[phone]  ? ?9:53 AM ? ? ?

## 2022-01-30 NOTE — Progress Notes (Addendum)
PROGRESS NOTE        PATIENT DETAILS Name: Krystal Sherman Age: 41 y.o. Sex: female Date of Birth: 1981-01-18 Admit Date: 01/28/2022 Admitting Physician Evalee Mutton Kristeen Mans, MD WLS:[id number], [facility]  Brief Summary: 41 year old with prior history of CVA, methamphetamine use-presented with left sided weakness-was found to have acute CVA.  See below for further details.  Significant Hospital events: 3/8>> admit to [facility] for left-sided weakness x2 days-found to have acute CVA.  Significant studies: 3/9>> MRI brain: Acute infarction involving right temporoparietal cortex, superior right frontal cortex and watershed pattern. 3/9>> brain MRA: Degraded by motion-advanced intracranial stenosis with a flow gap in the right supraclinoid ICA and left carotid terminus. 3/9>> neck MRA: Negative Motion degraded assessment 3/9>> CT angio head/neck: Carotid artery/vertebral artery normal in the neck.  Irregular stenosis in the cavernous carotid bilaterally, segmental occlusion of left M1 segment, decreased flow in the left MCA branches.  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 3/9>> TTE: EF 55-60%, small pericardial effusion-no evidence of tamponade. 3/9>> A1c: 4.7 3/9>> LDL: 91 3/9>> vitamin B12: 317 3/9>> CRP: 0.9 3/9>> ESR: Pending 3/9>> ANA: Pending 3/9>> hypercoagulable work-up: Pending 3/10>> TEE: Positive bubble study consistent with PFO  Significant microbiology data: 3/8>> COVID/influenza PCR: Negative  Procedures: None  Consults:  Neurology  Subjective: Lying comfortably in bed-no chest pain or shortness of breath.  Objective: Vitals: Blood pressure (!) 144/92, pulse 95, temperature 97.9 F (36.6 C), temperature source Temporal, resp. rate 20, SpO2 97 %.   Exam: Gen Exam:Alert awake-not in any distress HEENT:atraumatic, normocephalic Chest: B/L clear to auscultation anteriorly CVS:S1S2 regular Abdomen:soft non tender, non distended Extremities:no  edema Neurology: Slight left-sided weakness Skin: no rash   Pertinent Labs/Radiology: CBC Latest Ref Rng & Units 01/29/2022 01/28/2022 09/10/2019  WBC 4.0 - 10.5 K/uL 7.8 9.1 6.8  Hemoglobin 12.0 - 15.0 g/dL 13.3 13.2 11.8(L)  Hematocrit 36.0 - 46.0 % 40.7 40.6 37.4  Platelets 150 - 400 K/uL 229 218 269    Lab Results  Component Value Date   NA 135 01/29/2022   K 3.7 01/29/2022   CL 103 01/29/2022   CO2 24 01/29/2022      Assessment/Plan: Acute CVA: Minimal left-sided weakness-work-up as above-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  Await lower extremity Doppler.  On aspirin/Plavix/statin-we will await further recommendations from neurology.  PT/OT following-with recommendations for CIR on discharge.    HTN: Allow permissive hypertension-we will gradually lower blood pressure over the next few days.  On IV hydralazine/labetalol as needed.  Borderline vitamin B12 deficiency: Begin parenteral supplementation-we will place on oral supplementation on discharge.  Ongoing methamphetamine use: Claims to smoke methamphetamine daily-counseled this morning  History of cocaine use: Claims she does not do cocaine any longer-last use was several months back.   BMI: Estimated body mass index is 29.05 kg/m as calculated from the following:   Height as of 09/10/19: _0  (1.676 m).   Weight as of 09/10/19: 81.6 kg.   Code status:   Code Status: Full Code   DVT Prophylaxis: enoxaparin (LOVENOX) injection 40 mg Start: 01/29/22 1200 SCDs Start: 01/29/22 0123   Family Communication: None at bedside   Disposition Plan: Status is: Observation The patient will require care spanning > 2 midnights and should be moved to inpatient because: Acute CVA-extensive work-up including TEE planned for 3/10.   Planned Discharge  Destination: CIR   Diet: Diet Order             Diet Heart Room service appropriate? Yes; Fluid consistency: Thin  Diet effective now                      Antimicrobial agents: Anti-infectives (From admission, onward)    None        MEDICATIONS: Scheduled Meds:   stroke: mapping our early stages of recovery book   Does not apply Once   aspirin EC  81 mg Oral Daily   atorvastatin  40 mg Oral Daily   clopidogrel  75 mg Oral Daily   enoxaparin (LOVENOX) injection  40 mg Subcutaneous Q24H   Continuous Infusions: PRN Meds:.acetaminophen **OR** acetaminophen, hydrALAZINE, labetalol   I have personally reviewed following labs and imaging studies  LABORATORY DATA: CBC: Recent Labs  Lab 01/28/22 2350 01/29/22 0915  WBC 9.1 7.8  NEUTROABS 5.6  --   HGB 13.2 13.3  HCT 40.6 40.7  MCV 84.6 85.3  PLT 218 229     Basic Metabolic Panel: Recent Labs  Lab 01/28/22 2350 01/29/22 0915  NA 137 135  K 3.4* 3.7  CL 104 103  CO2 23 24  GLUCOSE 112* 149*  BUN 9 9  CREATININE 0.90 0.86  CALCIUM 8.8* 8.7*     GFR: CrCl cannot be calculated (Unknown ideal weight.).  Liver Function Tests: Recent Labs  Lab 01/28/22 2350  AST 19  ALT 16  ALKPHOS 100  BILITOT 0.4  PROT 6.5  ALBUMIN 3.3*    No results for input(s): LIPASE, AMYLASE in the last 168 hours. No results for input(s): AMMONIA in the last 168 hours.  Coagulation Profile: Recent Labs  Lab 01/28/22 2350  INR 0.9     Cardiac Enzymes: No results for input(s): CKTOTAL, CKMB, CKMBINDEX, TROPONINI in the last 168 hours.  BNP (last 3 results) No results for input(s): PROBNP in the last 8760 hours.  Lipid Profile: Recent Labs    01/29/22 0437  CHOL 155  HDL 32*  LDLCALC 91  TRIG 160*  CHOLHDL 4.8     Thyroid Function Tests: Recent Labs    01/29/22 0915  TSH 3.436     Anemia Panel: Recent Labs    01/29/22 0915  VITAMINB12 317     Urine analysis:    Component Value Date/Time   COLORURINE YELLOW 01/29/2022 0205   APPEARANCEUR HAZY (A) 01/29/2022 0205   LABSPEC 1.019 01/29/2022 0205   PHURINE 6.0 01/29/2022 0205    GLUCOSEU NEGATIVE 01/29/2022 0205   HGBUR NEGATIVE 01/29/2022 0205   BILIRUBINUR NEGATIVE 01/29/2022 0205   KETONESUR NEGATIVE 01/29/2022 0205   PROTEINUR NEGATIVE 01/29/2022 0205   NITRITE NEGATIVE 01/29/2022 0205   LEUKOCYTESUR TRACE (A) 01/29/2022 0205    Sepsis Labs: Lactic Acid, Venous No results found for: LATICACIDVEN  MICROBIOLOGY: Recent Results (from the past 240 hour(s))  Resp Panel by RT-PCR (Flu A&B, Covid) Nasopharyngeal Swab     Status: None   Collection Time: 01/28/22 11:44 PM   Specimen: Nasopharyngeal Swab; Nasopharyngeal(NP) swabs in vial transport medium  Result Value Ref Range Status   SARS Coronavirus 2 by RT PCR NEGATIVE NEGATIVE Final    Comment: (NOTE) SARS-CoV-2 target nucleic acids are NOT DETECTED.  The SARS-CoV-2 RNA is generally detectable in upper respiratory specimens during the acute phase of infection. The lowest concentration of SARS-CoV-2 viral copies this assay can detect is 138 copies/mL. A negative result does not  preclude SARS-Cov-2 infection and should not be used as the sole basis for treatment or other patient management decisions. A negative result may occur with  improper specimen collection/handling, submission of specimen other than nasopharyngeal swab, presence of viral mutation(s) within the areas targeted by this assay, and inadequate number of viral copies(<138 copies/mL). A negative result must be combined with clinical observations, patient history, and epidemiological information. The expected result is Negative.  Fact Sheet for Patients:  EntrepreneurPulse.com.au  Fact Sheet for Healthcare Providers:  IncredibleEmployment.be  This test is no t yet approved or cleared by the [place] FDA and  has been authorized for detection and/or diagnosis of SARS-CoV-2 by FDA under an Emergency Use Authorization (EUA). This EUA will remain  in effect (meaning this test can be used) for the  duration of the COVID-19 declaration under Section 564(b)(1) of the Act, 21 U.S.C.section 360bbb-3(b)(1), unless the authorization is terminated  or revoked sooner.       Influenza A by PCR NEGATIVE NEGATIVE Final   Influenza B by PCR NEGATIVE NEGATIVE Final    Comment: (NOTE) The Xpert Xpress SARS-CoV-2/FLU/RSV plus assay is intended as an aid in the diagnosis of influenza from Nasopharyngeal swab specimens and should not be used as a sole basis for treatment. Nasal washings and aspirates are unacceptable for Xpert Xpress SARS-CoV-2/FLU/RSV testing.  Fact Sheet for Patients: EntrepreneurPulse.com.au  Fact Sheet for Healthcare Providers: IncredibleEmployment.be  This test is not yet approved or cleared by the [place] FDA and has been authorized for detection and/or diagnosis of SARS-CoV-2 by FDA under an Emergency Use Authorization (EUA). This EUA will remain in effect (meaning this test can be used) for the duration of the COVID-19 declaration under Section 564(b)(1) of the Act, 21 U.S.C. section 360bbb-3(b)(1), unless the authorization is terminated or revoked.  Performed at [facility], [place] [address] [place], [place] [address]     RADIOLOGY STUDIES/RESULTS: CT ANGIO HEAD NECK W WO CM  Result Date: 01/29/2022 CLINICAL DATA:  Stroke follow-up.  Left-sided weakness EXAM: CT ANGIOGRAPHY HEAD AND NECK TECHNIQUE: Multidetector CT imaging of the head and neck was performed using the standard protocol during bolus administration of intravenous contrast. Multiplanar CT image reconstructions and MIPs were obtained to evaluate the vascular anatomy. Carotid stenosis measurements (when applicable) are obtained utilizing NASCET criteria, using the distal internal carotid diameter as the denominator. RADIATION DOSE REDUCTION: This exam was performed according to the departmental dose-optimization program which includes automated exposure  control, adjustment of the mA and/or kV according to patient size and/or use of iterative reconstruction technique. CONTRAST:  169mL OMNIPAQUE IOHEXOL 350 MG/ML SOLN COMPARISON:  CT head and MRI head 01/29/2022 FINDINGS: CT HEAD FINDINGS Brain: Hypodensity right parietal lobe unchanged compatible with acute infarct. No associated hemorrhage. Chronic infarct right occipital lobe unchanged. Chronic infarct left basal ganglia unchanged. Chronic infarct left frontal parietal lobe unchanged. Ventricle size normal.  No acute hemorrhage. Vascular: Negative for hyperdense vessel Skull: Negative Sinuses: Mild mucosal edema paranasal sinuses. Orbits: Negative Review of the MIP images confirms the above findings CTA NECK FINDINGS Aortic arch: Standard branching. Imaged portion shows no evidence of aneurysm or dissection. No significant stenosis of the major arch vessel origins. Right carotid system: Right carotid bifurcation normal. Negative for atherosclerotic disease or dissection. Tortuous right internal carotid artery Left carotid system: Left carotid bifurcation normal. No atherosclerotic disease or dissection. Tortuous left internal carotid artery. Vertebral arteries: Normal vertebral arteries bilaterally. No stenosis or dissection. Skeleton: Incomplete segmentation  of C6 and C7, congenital. No acute skeletal abnormality. Other neck: Negative for mass or adenopathy in the neck. Upper chest: Lung apices clear bilaterally. Review of the MIP images confirms the above findings CTA HEAD FINDINGS Anterior circulation: Irregularity and diffuse narrowing of the cavernous carotid bilaterally. Scattered small calcifications. Moderate stenosis right cavernous and supraclinoid internal carotid artery. Mild stenosis left cavernous and supraclinoid internal carotid artery. Anterior cerebral artery patent bilaterally without stenosis. Right M1 segment patent. Relatively small right middle cerebral artery without stenosis or large  vessel occlusion. Segmental occlusion left M1 segment. There are collaterals in the area with decreased opacification of left MCA branches due to collaterals flow. Posterior circulation: Both vertebral arteries patent to the basilar. PICA patent bilaterally. Basilar patent. Fetal origin left posterior cerebral artery. Both posterior cerebral arteries are patent. Mild stenosis right PCA. No aneurysm. Venous sinuses: Normal venous enhancement Anatomic variants: None Review of the MIP images confirms the above findings IMPRESSION: 1. Moderately large acute infarct right parietal lobe unchanged. No acute hemorrhage. 2. Chronic infarcts in the right occipital lobe, left frontal lobe, left basal ganglia unchanged. Advanced cerebrovascular disease for age. 3. Carotid artery and vertebral artery normal in the neck. No significant atherosclerotic disease in the neck. 4. Irregular stenosis in the cavernous carotid bilaterally, right greater than left. Several small calcifications are present in the cavernous carotid bilaterally 5. Segmental occlusion left M1 segment. Decreased flow in the left MCA branches due to collateral flow. 6. 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 Intracranial vasculitis possible. Intracranial atherosclerotic disease possible however lack of atherosclerotic disease in the neck and the patient's age reduce the likelihood of atherosclerotic disease. Electronically Signed   By: Franchot Gallo M.D.   On: 01/29/2022 09:16   CT HEAD WO CONTRAST (5MM)  Result Date: 01/29/2022 CLINICAL DATA:  Initial evaluation for left-sided weakness for 2 days. EXAM: CT HEAD WITHOUT CONTRAST TECHNIQUE: Contiguous axial images were obtained from the base of the skull through the vertex without intravenous contrast. RADIATION DOSE REDUCTION: This exam was performed according to the departmental dose-optimization program which includes automated exposure control, adjustment of the mA and/or kV according to patient size and/or use of  iterative reconstruction technique. COMPARISON:  Prior head CT from 09/10/2019. FINDINGS: Brain: Cerebral volume within normal limits for age. Chronic encephalomalacia involving the anterior left frontal lobe consistent with a chronic left MCA distribution infarct. Additional chronic left basal ganglia infarct noted. Evolving cytotoxic edema present within the right parieto-occipital region, consistent with an acute to early subacute right MCA distribution and/or MCA/PCA watershed distribution infarct. Suspected faint petechial blood products without frank hemorrhagic transformation. Localized edema without significant regional mass effect. No other acute large vessel territory infarct or intracranial hemorrhage. No mass lesion or significant midline shift. No hydrocephalus or extra-axial fluid collection. Vascular: No hyperdense vessel. Skull: Scalp soft tissues demonstrate no acute finding. Calvarium intact. Sinuses/Orbits: Globes and orbital soft tissues demonstrate no acute finding. Small right sphenoid sinus retention cyst noted. Paranasal sinuses are otherwise clear. Moderate right with small left mastoid effusions. Other: None. IMPRESSION: 1. Evolving cytotoxic edema within the right parieto-occipital region, consistent with an acute to early subacute right MCA and/or MCA/PCA watershed distribution infarct. Probable associated faint petechial blood products without frank hemorrhagic transformation. Localized edema without significant regional mass effect. 2. No other acute intracranial abnormality. 3. 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 Electronically Signed   By: Jeannine Boga M.D.   On: 01/29/2022 00:46   MR ANGIO HEAD WO CONTRAST  Result Date: 01/29/2022 CLINICAL DATA:  Stroke-like symptoms. Left-sided weakness since Monday morning EXAM: MRI HEAD WITHOUT CONTRAST MRA HEAD WITHOUT CONTRAST MRA OF THE NECK WITHOUT AND WITH CONTRAST TECHNIQUE: Multiplanar, multi-echo pulse sequences of  the brain and surrounding structures were acquired without intravenous contrast. Angiographic images of the Circle of Willis were acquired using MRA technique without intravenous contrast. Angiographic images of the neck were acquired using MRA technique without and with intravenous contrast. Carotid stenosis measurements (when applicable) are obtained utilizing NASCET criteria, using the distal internal carotid diameter as the denominator. CONTRAST:  7.34m GADAVIST GADOBUTROL 1 MMOL/ML IV SOLN COMPARISON:  Head CT from earlier the same day FINDINGS: MR HEAD FINDINGS Brain: Extensive primarily cortically based right cerebral infarction affecting the temporoparietal cortex and extending superiorly along the superior right frontal cortex. Mild T1 hyperintensity at the infarct compatible with petechial blood products when correlated with CT. Pre-existing right occipital and left superior frontal cortex infarcts. Chronic perforator infarct at the left basal ganglia. No hematoma, hydrocephalus, or herniation. Pervasive motion artifact. Chronic blood products at the remote perforator infarct on the left. Vascular: See below Skull and upper cervical spine: No focal marrow lesion Sinuses/Orbits: Negative Other: Motion artifact. MRA HEAD FINDINGS Anterior circulation: Irregular appearance of the carotid siphons with flow gap at the right supraclinoid segment. No flow seen at the left M1 or A1 segments with diminutive downstream signal. [facility] Posterior circulation: Patent vertebral and basilar arteries. Fetal type left PCA flow. Bilateral PCA narrowing, particularly high-grade at the right P1 and P3 segments. Anatomic variants:As above. MRA NECK FINDINGS Aortic arch: Unremarkable with 3 vessel branching Right carotid system: Tortuous ICA. No evidence of stenosis or dissection. Left carotid system: Tortuous ICA. No visible stenosis or dissection. Vertebral arteries: The vertebral  arteries are smoothly contoured and diffusely patent. IMPRESSION: Brain MRI: 1. Extensive acute infarction in the right temporoparietal cortex and in the superior right frontal cortex, roughly watershed pattern. 2. Right occipital cortex infarct which is chronic but has occurred since May 2022 comparison. More remote left superior frontal infarct. 3. Motion degraded Intracranial MRA: 1. Degraded by motion. 2. Advanced intracranial stenoses with flow gap at the right supraclinoid ICA and left carotid terminus/A1/M1 segments. Question moyamoya disease. Neck MRA: Negative motion degraded assessment. Electronically Signed   By: JJorje GuildM.D.   On: 01/29/2022 07:12   MR ANGIO NECK W WO CONTRAST  Result Date: 01/29/2022 CLINICAL DATA:  Stroke-like symptoms. Left-sided weakness since Monday morning EXAM: MRI HEAD WITHOUT CONTRAST MRA HEAD WITHOUT CONTRAST MRA OF THE NECK WITHOUT AND WITH CONTRAST TECHNIQUE: Multiplanar, multi-echo pulse sequences of the brain and surrounding structures were acquired without intravenous contrast. Angiographic images of the Circle of Willis were acquired using MRA technique without intravenous contrast. Angiographic images of the neck were acquired using MRA technique without and with intravenous contrast. Carotid stenosis measurements (when applicable) are obtained utilizing NASCET criteria, using the distal internal carotid diameter as the denominator. CONTRAST:  7.518mGADAVIST GADOBUTROL 1 MMOL/ML IV SOLN COMPARISON:  Head CT from earlier the same day FINDINGS: MR HEAD FINDINGS Brain: Extensive primarily cortically based right cerebral infarction affecting the temporoparietal cortex and extending superiorly along the superior right frontal cortex. Mild T1 hyperintensity at the infarct compatible with petechial blood products when correlated with CT. Pre-existing right occipital and left superior frontal cortex infarcts. Chronic perforator infarct at the left basal ganglia. No  hematoma, hydrocephalus, or herniation. Pervasive motion artifact.  Chronic blood products at the remote perforator infarct on the left. Vascular: See below Skull and upper cervical spine: No focal marrow lesion Sinuses/Orbits: Negative Other: Motion artifact. MRA HEAD FINDINGS Anterior circulation: Irregular appearance of the carotid siphons with flow gap at the right supraclinoid segment. No flow seen at the left M1 or A1 segments with diminutive downstream signal. Reportedly there was a normal MRA in 2019 at [facility]. Posterior circulation: Patent vertebral and basilar arteries. Fetal type left PCA flow. Bilateral PCA narrowing, particularly high-grade at the right P1 and P3 segments. Anatomic variants:As above. MRA NECK FINDINGS Aortic arch: Unremarkable with 3 vessel branching Right carotid system: Tortuous ICA. No evidence of stenosis or dissection. Left carotid system: Tortuous ICA. No visible stenosis or dissection. Vertebral arteries: The vertebral arteries are smoothly contoured and diffusely patent. IMPRESSION: Brain MRI: 1. Extensive acute infarction in the right temporoparietal cortex and in the superior right frontal cortex, roughly watershed pattern. 2. Right occipital cortex infarct which is chronic but has occurred since May 2022 comparison. More remote left superior frontal infarct. 3. Motion degraded Intracranial MRA: 1. Degraded by motion. 2. Advanced intracranial stenoses with flow gap at the right supraclinoid ICA and left carotid terminus/A1/M1 segments. Question moyamoya disease. Neck MRA: Negative motion degraded assessment. Electronically Signed   By: Jorje Guild M.D.   On: 01/29/2022 07:12   MR BRAIN WO CONTRAST  Result Date: 01/29/2022 CLINICAL DATA:  Stroke-like symptoms. Left-sided weakness since Monday morning EXAM: MRI HEAD WITHOUT CONTRAST MRA HEAD WITHOUT CONTRAST MRA OF THE NECK WITHOUT AND WITH CONTRAST TECHNIQUE: Multiplanar, multi-echo pulse sequences of the brain and  surrounding structures were acquired without intravenous contrast. Angiographic images of the Circle of Willis were acquired using MRA technique without intravenous contrast. Angiographic images of the neck were acquired using MRA technique without and with intravenous contrast. Carotid stenosis measurements (when applicable) are obtained utilizing NASCET criteria, using the distal internal carotid diameter as the denominator. CONTRAST:  7.57m GADAVIST GADOBUTROL 1 MMOL/ML IV SOLN COMPARISON:  Head CT from earlier the same day FINDINGS: MR HEAD FINDINGS Brain: Extensive primarily cortically based right cerebral infarction affecting the temporoparietal cortex and extending superiorly along the superior right frontal cortex. Mild T1 hyperintensity at the infarct compatible with petechial blood products when correlated with CT. Pre-existing right occipital and left superior frontal cortex infarcts. Chronic perforator infarct at the left basal ganglia. No hematoma, hydrocephalus, or herniation. Pervasive motion artifact. Chronic blood products at the remote perforator infarct on the left. Vascular: See below Skull and upper cervical spine: No focal marrow lesion Sinuses/Orbits: Negative Other: Motion artifact. MRA HEAD FINDINGS Anterior circulation: Irregular appearance of the carotid siphons with flow gap at the right supraclinoid segment. No flow seen at the left M1 or A1 segments with diminutive downstream signal. [facility] Posterior circulation: Patent vertebral and basilar arteries. Fetal type left PCA flow. Bilateral PCA narrowing, particularly high-grade at the right P1 and P3 segments. Anatomic variants:As above. MRA NECK FINDINGS Aortic arch: Unremarkable with 3 vessel branching Right carotid system: Tortuous ICA. No evidence of stenosis or dissection. Left carotid system: Tortuous ICA. No visible stenosis or dissection. Vertebral arteries: The vertebral arteries are  smoothly contoured and diffusely patent. IMPRESSION: Brain MRI: 1. Extensive acute infarction in the right temporoparietal cortex and in the superior right frontal cortex, roughly watershed pattern. 2. Right occipital cortex infarct which is chronic but has occurred since May 2022 comparison. More remote left superior  frontal infarct. 3. Motion degraded Intracranial MRA: 1. Degraded by motion. 2. Advanced intracranial stenoses with flow gap at the right supraclinoid ICA and left carotid terminus/A1/M1 segments. Question moyamoya disease. Neck MRA: Negative motion degraded assessment. Electronically Signed   By: Jorje Guild M.D.   On: 01/29/2022 07:12   ECHOCARDIOGRAM COMPLETE  Result Date: 01/29/2022    ECHOCARDIOGRAM REPORT   Patient Name:   Krystal Sherman Date of Exam: 01/29/2022 Medical Rec #:  [id number]      Height:       66.0 in Accession #:    [id number]     Weight:       180.0 lb Date of Birth:  May 23, 1981      BSA:          1.912 m Patient Age:    72 years       BP:           224/137 mmHg Patient Gender: F              HR:           75 bpm. Exam Location:  Inpatient Procedure: 2D Echo, 3D Echo, Cardiac Doppler, Color Doppler and Strain Analysis Indications:    Stroke  History:        Patient has no prior history of Echocardiogram examinations.                 Risk Factors:Hypertension.  Sonographer:    Roseanna Rainbow RDCS Referring Phys: [id number] Rhetta Mura  Sonographer Comments: Technically difficult study due to poor echo windows, suboptimal parasternal window, suboptimal apical window, suboptimal subcostal window and patient is morbidly obese. Image acquisition challenging due to patient body habitus. Global longitudinal strain was attempted. Difficult exam, patient moving throuout, and has on restrictive clothing. IMPRESSIONS  1. Left ventricular ejection fraction, by estimation, is 55 to 60%. Left ventricular ejection fraction by 3D volume is 56 %. The left ventricle has normal function. The left  ventricle has no regional wall motion abnormalities. There is severe asymmetric  left ventricular hypertrophy of the basal-septal segment. Left ventricular diastolic parameters are consistent with Grade I diastolic dysfunction (impaired relaxation). The average left ventricular global longitudinal strain is -19.0 %. The global longitudinal strain is normal.  2. Right ventricular systolic function is normal. The right ventricular size is normal. Tricuspid regurgitation signal is inadequate for assessing PA pressure.  3. A small pericardial effusion is present. The pericardial effusion is lateral to the left ventricle. There is no evidence of cardiac tamponade.  4. The mitral valve is normal in structure. No evidence of mitral valve regurgitation. No evidence of mitral stenosis.  5. The aortic valve is tricuspid. Aortic valve regurgitation is not visualized.  6. The inferior vena cava is normal in size with greater than 50% respiratory variability, suggesting right atrial pressure of 3 mmHg. Comparison(s): No prior Echocardiogram. FINDINGS  Left Ventricle: Left ventricular ejection fraction, by estimation, is 55 to 60%. Left ventricular ejection fraction by 3D volume is 56 %. The left ventricle has normal function. The left ventricle has no regional wall motion abnormalities. The average left ventricular global longitudinal strain is -19.0 %. The global longitudinal strain is normal. The left ventricular internal cavity size was normal in size. There is severe asymmetric left ventricular hypertrophy of the basal-septal segment. Left ventricular diastolic parameters are consistent with Grade I diastolic dysfunction (impaired relaxation). Right Ventricle: The right ventricular size is normal. No increase in right ventricular wall  thickness. Right ventricular systolic function is normal. Tricuspid regurgitation signal is inadequate for assessing PA pressure. Left Atrium: Left atrial size was normal in size. Right Atrium:  Right atrial size was normal in size. Pericardium: A small pericardial effusion is present. The pericardial effusion is lateral to the left ventricle. There is no evidence of cardiac tamponade. Presence of epicardial fat layer. Mitral Valve: The mitral valve is normal in structure. No evidence of mitral valve regurgitation. No evidence of mitral valve stenosis. MV peak gradient, 10.6 mmHg. The mean mitral valve gradient is 3.0 mmHg. Tricuspid Valve: The tricuspid valve is normal in structure. Tricuspid valve regurgitation is not demonstrated. No evidence of tricuspid stenosis. Aortic Valve: The aortic valve is tricuspid. Aortic valve regurgitation is not visualized. Pulmonic Valve: The pulmonic valve was not well visualized. Pulmonic valve regurgitation is not visualized. No evidence of pulmonic stenosis. Aorta: The aortic root and ascending aorta are structurally normal, with no evidence of dilitation. Venous: The inferior vena cava is normal in size with greater than 50% respiratory variability, suggesting right atrial pressure of 3 mmHg. IAS/Shunts: No atrial level shunt detected by color flow Doppler.  LEFT VENTRICLE PLAX 2D LVIDd:         5.00 cm         Diastology LVIDs:         3.50 cm         LV e' medial:    3.70 cm/s LV PW:         1.30 cm         LV E/e' medial:  21.8 LV IVS:        1.50 cm         LV e' lateral:   3.92 cm/s LVOT diam:     2.30 cm         LV E/e' lateral: 20.6 LV SV:         94 LV SV Index:   49              2D LVOT Area:     4.15 cm        Longitudinal                                Strain                                2D Strain GLS  -19.0 % LV Volumes (MOD)               Avg: LV vol d, MOD    93.3 ml A2C:                           3D Volume EF LV vol d, MOD    92.6 ml       LV 3D EF:    Left A4C:                                        ventricul LV vol s, MOD    35.2 ml                    ar A2C:  ejection LV vol s, MOD    37.7 ml                     fraction A4C:                                        by 3D LV SV MOD A2C:   58.1 ml                    volume is LV SV MOD A4C:   92.6 ml                    56 %. LV SV MOD BP:    59.2 ml                                 3D Volume EF:                                3D EF:        56 %                                LV EDV:       128 ml                                LV ESV:       57 ml                                LV SV:        71 ml RIGHT VENTRICLE             IVC RV S prime:     13.90 cm/s  IVC diam: 1.30 cm TAPSE (M-mode): 2.2 cm LEFT ATRIUM             Index        RIGHT ATRIUM          Index LA diam:        3.60 cm 1.88 cm/m   RA Area:     7.45 cm LA Vol (A2C):   29.8 ml 15.58 ml/m  RA Volume:   9.50 ml  4.97 ml/m LA Vol (A4C):   46.2 ml 24.16 ml/m LA Biplane Vol: 38.8 ml 20.29 ml/m  AORTIC VALVE LVOT Vmax:   110.00 cm/s LVOT Vmean:  75.700 cm/s LVOT VTI:    0.226 m  AORTA Ao Root diam: 3.30 cm Ao Asc diam:  3.80 cm MITRAL VALVE MV Area (PHT): 2.69 cm     SHUNTS MV Area VTI:   3.02 cm     Systemic VTI:  0.23 m MV Peak grad:  10.6 mmHg    Systemic Diam: 2.30 cm MV Mean grad:  3.0 mmHg MV Vmax:       1.63 m/s MV Vmean:      79.0 cm/s MV Decel Time: 282 msec MV E velocity: 80.60 cm/s MV A velocity: 121.00 cm/s MV E/A ratio:  0.67 Rudean Haskell MD Electronically signed by Rudean Haskell MD Signature Date/Time: 01/29/2022/1:47:24 PM  Final      LOS: 1 day   Oren Binet, MD  [facility]    To contact the attending provider between 7A-7P or the covering provider during after hours 7P-7A, please log into the web site [email or web address] and access using universal [place] password for that web site. If you do not have the password, please call the hospital operator.  01/30/2022, 1:47 PM

## 2022-01-30 NOTE — Progress Notes (Signed)
Inpatient Rehab Admissions Coordinator Note:  ? ?Per therapy recommendations patient was screened for CIR candidacy by Stephania Fragmin, PT. At this time, pt appears to be a potential candidate for CIR. I will place an order for rehab consult for full assessment, per our protocol.  Please contact me any with questions.. ? ?Estill Dooms, PT, DPT ?[phone] ?01/30/22 ?11:37 AM  ?

## 2022-01-30 NOTE — Interval H&P Note (Signed)
History and Physical Interval Note: ? ?01/30/2022 ?9:30 AM ? ?[name]  has presented today for surgery, with the diagnosis of stroke.  The various methods of treatment have been discussed with the patient and family. After consideration of risks, benefits and other options for treatment, the patient has consented to  Procedure(s): ?TRANSESOPHAGEAL ECHOCARDIOGRAM (TEE) (N/A) as a surgical intervention.  The patient's history has been reviewed, patient examined, no change in status, stable for surgery.  I have reviewed the patient's chart and labs.  Questions were answered to the patient's satisfaction.   ? ? ?Krystal Sherman ? ? ?

## 2022-01-30 NOTE — Transfer of Care (Signed)
Immediate Anesthesia Transfer of Care Note ? ?Patient: Krystal Sherman ? ?Procedure(s) Performed: TRANSESOPHAGEAL ECHOCARDIOGRAM (TEE) ?BUBBLE STUDY ? ?Patient Location: PACU and Endoscopy Unit ? ?Anesthesia Type:MAC ? ?Level of Consciousness: awake and drowsy ? ?Airway & Oxygen Therapy: Patient Spontanous Breathing ? ?Post-op Assessment: Report given to RN and Post -op Vital signs reviewed and stable ? ?Post vital signs: Reviewed and stable ? ?Last Vitals:  ?Vitals Value Taken Time  ?BP 147/84 01/30/22 0958  ?Temp 36.6 ?C 01/30/22 0958  ?Pulse 92 01/30/22 0959  ?Resp 24 01/30/22 0959  ?SpO2 99 % 01/30/22 0959  ?Vitals shown include unvalidated device data. ? ?Last Pain:  ?Vitals:  ? 01/30/22 0958  ?TempSrc: Temporal  ?PainSc: Asleep  ?   ? ?Patients Stated Pain Goal: 0 (01/30/22 0747) ? ?Complications: No notable events documented. ?

## 2022-01-30 NOTE — Progress Notes (Signed)
?  Echocardiogram ?Echocardiogram Transesophageal has been performed. ? ?Krystal Sherman ?01/30/2022, 10:19 AM ?

## 2022-01-30 NOTE — Progress Notes (Signed)
STROKE TEAM PROGRESS NOTE  ? ?INTERVAL HISTORY ?Patient is sitting in bed, still has mild left hand weakness along with her chronic right hand weakness. Moving BLEs well. Mild dysarthria but still has left hemianopia. PT/OT recommend CIR. TEE showed positive bubble. Will do TCD bubble and LE venous doppler. Pt is willing to quit smoking and meth.  ? ?Vitals:  ? 01/30/22 0917 01/30/22 0958 01/30/22 1007 01/30/22 1016  ?BP: (!) 190/90 (!) 147/84 (!) 156/90 (!) 144/92  ?Pulse: (!) 102 99 92 95  ?Resp: (!) 22 18 20 20   ?Temp: (!) 96.1 ?F (35.6 ?C) 97.9 ?F (36.6 ?C)    ?TempSrc: Temporal Temporal    ?SpO2: 98% 97% 98% 97%  ? ?CBC:  ?Recent Labs  ?Lab 01/28/22 ?2350 01/29/22 ?0915  ?WBC 9.1 7.8  ?NEUTROABS 5.6  --   ?HGB 13.2 13.3  ?HCT 40.6 40.7  ?MCV 84.6 85.3  ?PLT 218 229  ? ?Basic Metabolic Panel:  ?Recent Labs  ?Lab 01/28/22 ?2350 01/29/22 ?0915  ?NA 137 135  ?K 3.4* 3.7  ?CL 104 103  ?CO2 23 24  ?GLUCOSE 112* 149*  ?BUN 9 9  ?CREATININE 0.90 0.86  ?CALCIUM 8.8* 8.7*  ? ?Lipid Panel:  ?Recent Labs  ?Lab 01/29/22 ?0437  ?CHOL 155  ?TRIG 160*  ?HDL 32*  ?CHOLHDL 4.8  ?VLDL 32  ?LDLCALC 91  ? ?HgbA1c:  ?Recent Labs  ?Lab 01/29/22 ?0437  ?HGBA1C 4.7*  ? ?Urine Drug Screen:  ?Recent Labs  ?Lab 01/29/22 ?0204  ?LABOPIA NONE DETECTED  ?COCAINSCRNUR NONE DETECTED  ?LABBENZ NONE DETECTED  ?AMPHETMU POSITIVE*  ?THCU NONE DETECTED  ?LABBARB NONE DETECTED  ?  ?Alcohol Level  ?Recent Labs  ?Lab 01/28/22 ?2350  ?ETH <10  ? ? ?IMAGING past 24 hours ?No results found. ? ?PHYSICAL EXAM ?General:  Alert, obese patient, tearful ? ? ?NEURO:  ?Awake alert, answer questions appropriately, orientated to age, place, people and month but wrong on year. No aphasia but mild dysarthria, able to name and repeat. No gaze palsy, tracking bilaterally, left hemianopia. Mild left facial droop. Tongue midline. RUE proximal 5/5 with distal 3/5 finger grip, and RLE 5/5, no drift. LUE proximal 4+/5 and distal 3/5 finger grip, and LLE 4+/5 with minimal  drift. Sensation symmetrical bilaterally, b/l FTN grossly intact now, gait not tested.  ? ? ?ASSESSMENT/PLAN ?Krystal Sherman is a 41 y.o. female with history of HTN and substance abuse presenting with left arm and leg weakness which occurred yesterday after awakening.  She called 911 and presented to the ED.  MRI shows infarction in the right temporparietal cortex and superior right frontal cortex.  Patient has a history of uncontrolled HTN and states that she does not have a PCP at this time.  She also has a history of cocaine and methamphetamine use.  Discussed need for better control of hypertension and cessation of substance abuse with patient.  She states that she is willing to establish care with a PCP, take medications for her hypertension and that she would like assistance with substance use cessation. ? 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 ?CTA head & neck irregular stenosis in bilateral cavernous carotids right more than left, segmental occlusion of left M1 segment, consider moyamoya  disease ?MRI  extensive infarct in right temporparietal cortex and superior right frontal cortex ?MRA  advanced intracranial stenosis with flow gap at right supraclinoid ICA and left carotid terminus ?2D Echo EF 55-60% ?TEE Positive bubble study consistent with PFO ?LE venous Doppler no DVT ?TCD bubble study spencer degree 0 at rest, 1-2 with valsalva ?LDL 91 ?HgbA1c 4.7 ?UDS positive for amphetamine  ?Hypercoagulable and autoimmune work up pending ?VTE prophylaxis - lovenox ?No antithrombotic prior to admission, now on aspirin 81 mg daily and clopidogrel 75 mg daily for 3 months and then ASA alone.  ?Therapy recommendations:   CIR ?Disposition:  pending ? 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 ?On DAPT and statin ? ?Hx of stroke ?04/2018 presented to ED for right sided weakness and numbness for 2 weeks as well as aphasia. MRi showed left frontal infarcts. MRA head and neck unremarkable. CT negative. Discharged with ASA and lipitor 80.  ?Pt did not follow up with physician and not compliant with meds ? ?Hypertension, uncontrolled ?Home meds:  none ?Unstable, requiring IV hydralazine and labetalol ?gradually normalize in 3-5 days ?Long-term BP goal normotensive ? ?Hyperlipidemia ?Home meds:  none ?LDL 91, goal < 70 ?Add atorvastatin 40 mg  ?Continue statin at discharge ? ?PFO, likely tiny ?TEE Positive bubble study consistent with PFO ?LE venous Doppler no DVT ?TCD bubble study spencer degree 0 at rest, 1-2 with valsalva ?ROPE score 5-6 ?Many uncontrolled risk factors for stroke ?Do not think this tiny PFO is significant ?No PFO closure needed ? ?Polysubstance abuse ?Patient has history of methamphetamine and cocaine use ?UDS positive for amphetamine ?Patient is willing to quit ?TOC consult placed for cessation assistance ? ?Tobacco abuse ?Current smoker ?Smoking cessation counseling provided ?Pt is willing to quit ? ?Other Stroke Risk Factors ?Morbid obesity, BMI >/= 30 associated with increased stroke risk, recommend weight loss, diet and exercise as appropriate  ?Family hx stroke  ? ?Other Active Problems ? ? ?Hospital day # 1 ? ?Neurology will sign off. Please call with questions. Pt will follow up with stroke clinic NP at [facility] in about 4 weeks. Thanks for the consult. ? ? ?Marvel Plan, MD PhD ?Stroke Neurology ?01/30/2022 ?1:52  PM ? ? ?  ? ?To contact Stroke Continuity provider, please refer to [email or web address]. ?After hours, contact General Neurology  ?

## 2022-01-30 NOTE — Progress Notes (Addendum)
Physical Therapy Treatment ?Patient Details ?Name: Krystal Sherman ?MRN: [id number] ?DOB: Jul 23, 1981 ?Today's Date: 01/30/2022 ? ? ?History of Present Illness Pt is a 41 y/o female presenting on 3/8 with L sided weakness. MRI with extensive acute infarction in R temporoparietal cortex and in superior R frontal cortext, roughly watershed pattern, R occupital cortex infract chronic (but new since last CVA).  PMH includes: CVA with residual R sided weakness, HTN, and polysubstance abuse. ? ?  ?PT Comments  ? ? Pt required min guard assist bed mobility, min assist sit transfers, and min assist sidestepping bedside 3' without AD. Unable to progress gait this session due to transport arriving to take pt for TEE.    ?Recommendations for follow up therapy are one component of a multi-disciplinary discharge planning process, led by the attending physician.  Recommendations may be updated based on patient status, additional functional criteria and insurance authorization. ? ?Follow Up Recommendations ? Acute inpatient rehab (3hours/day) ?  ?  ?Assistance Recommended at Discharge Frequent or constant Supervision/Assistance  ?Patient can return home with the following A lot of help with walking and/or transfers;Assistance with cooking/housework;Assistance with feeding;Direct supervision/assist for medications management;Direct supervision/assist for financial management;Assist for transportation;Help with stairs or ramp for entrance;A lot of help with bathing/dressing/bathroom ?  ?Equipment Recommendations ? Rolling walker (2 wheels)  ?  ?Recommendations for Other Services   ? ? ?  ?Precautions / Restrictions Precautions ?Precautions: Fall;Other (comment) ?Precaution Comments: monitor BP ?Restrictions ?Weight Bearing Restrictions: No  ?  ? ?Mobility ? Bed Mobility ?Overal bed mobility: Needs Assistance ?Bed Mobility: Supine to Sit ?  ?  ?Supine to sit: Min guard, HOB elevated ?  ?  ?General bed mobility comments: increased time,  +rail, min guard for safety/cues ?  ? ?Transfers ?Overall transfer level: Needs assistance ?Equipment used: None ?Transfers: Sit to/from Stand, Bed to chair/wheelchair/BSC ?Sit to Stand: Min assist ?  ?Step pivot transfers: Min assist ?  ?  ?  ?General transfer comment: assist to power up and stabilize balance, able to take pivot steps bed to wheelchair for transport to TEE ?  ? ?Ambulation/Gait ?Ambulation/Gait assistance: Min assist ?Gait Distance (Feet): 3 Feet ?Assistive device: None ?Gait Pattern/deviations: Step-to pattern ?  ?  ?  ?General Gait Details: side stepping bedside. Assist to stabilize balance ? ? ?Stairs ?  ?  ?  ?  ?  ? ? ?Wheelchair Mobility ?  ? ?Modified Rankin (Stroke Patients Only) ?Modified Rankin (Stroke Patients Only) ?Pre-Morbid Rankin Score: No significant disability ?Modified Rankin: Moderately severe disability ? ? ?  ?Balance Overall balance assessment: Needs assistance ?Sitting-balance support: No upper extremity supported, Feet supported ?Sitting balance-Leahy Scale: Fair ?  ?  ?Standing balance support: No upper extremity supported, During functional activity ?Standing balance-Leahy Scale: Poor ?Standing balance comment: external assist to stabilize balance ?  ?  ?  ?  ?  ?  ?  ?  ?  ?  ?  ?  ? ?  ?Cognition Arousal/Alertness: Awake/alert ?Behavior During Therapy: Flat affect ?Overall Cognitive Status: No family/caregiver present to determine baseline cognitive functioning ?Area of Impairment: Attention, Memory, Following commands, Safety/judgement, Awareness, Problem solving ?  ?  ?  ?  ?  ?  ?  ?  ?  ?Current Attention Level: Sustained ?Memory: Decreased short-term memory, Decreased recall of precautions ?Following Commands: Follows one step commands consistently, Follows one step commands with increased time, Follows multi-step commands inconsistently ?Safety/Judgement: Decreased awareness of safety, Decreased awareness of deficits ?Awareness: Emergent ?  Problem Solving: Slow  processing, Decreased initiation, Difficulty sequencing, Requires verbal cues, Requires tactile cues ?General Comments: Repeatedly asking about her husband. ?  ?  ? ?  ?Exercises   ? ?  ?General Comments General comments (skin integrity, edema, etc.): BP 147/84. Pt soiled with urine on arrival. Pt unaware. ?  ?  ? ?Pertinent Vitals/Pain Pain Assessment ?Pain Assessment: No/denies pain  ? ? ?Home Living   ?  ?  ?  ?  ?  ?  ?  ?  ?  ?   ?  ?Prior Function    ?  ?  ?   ? ?PT Goals (current goals can now be found in the care plan section) Acute Rehab PT Goals ?Patient Stated Goal: home ?Progress towards PT goals: Progressing toward goals ? ?  ?Frequency ? ? ? Min 4X/week ? ? ? ?  ?PT Plan Current plan remains appropriate  ? ? ?Co-evaluation   ?  ?  ?  ?  ? ?  ?AM-PAC PT "6 Clicks" Mobility   ?Outcome Measure ? Help needed turning from your back to your side while in a flat bed without using bedrails?: A Little ?Help needed moving from lying on your back to sitting on the side of a flat bed without using bedrails?: A Little ?Help needed moving to and from a bed to a chair (including a wheelchair)?: A Little ?Help needed standing up from a chair using your arms (e.g., wheelchair or bedside chair)?: A Little ?Help needed to walk in hospital room?: A Lot ?Help needed climbing 3-5 steps with a railing? : A Lot ?6 Click Score: 16 ? ?  ?End of Session Equipment Utilized During Treatment: Gait belt ?Activity Tolerance: Other (comment) (transport arrived to take pt for TEE) ?Patient left: Other (comment) (in wheelchair with transport) ?Nurse Communication: Mobility status ?PT Visit Diagnosis: Unsteadiness on feet (R26.81);Muscle weakness (generalized) (M62.81);Other symptoms and signs involving the nervous system (R29.898);Other abnormalities of gait and mobility (R26.89) ?  ? ? ?Time: 8366-2947 ?PT Time Calculation (min) (ACUTE ONLY): 13 min ? ?Charges:  $Therapeutic Activity: 8-22 mins          ?          ? ?Aida Raider, PT   ?Office # [phone] ?Pager [phone] ? ? ? ?Ilda Foil ?01/30/2022, 10:06 AM ? ? ? ?

## 2022-01-30 NOTE — Anesthesia Postprocedure Evaluation (Signed)
Anesthesia Post Note ? ?Patient: Krystal Sherman ? ?Procedure(s) Performed: TRANSESOPHAGEAL ECHOCARDIOGRAM (TEE) ?BUBBLE STUDY ? ?  ? ?Patient location during evaluation: Endoscopy ?Anesthesia Type: MAC ?Level of consciousness: awake and alert ?Pain management: pain level controlled ?Vital Signs Assessment: post-procedure vital signs reviewed and stable ?Respiratory status: spontaneous breathing, nonlabored ventilation and respiratory function stable ?Cardiovascular status: blood pressure returned to baseline and stable ?Postop Assessment: no apparent nausea or vomiting ?Anesthetic complications: no ? ? ?No notable events documented. ? ?Last Vitals:  ?Vitals:  ? 01/30/22 1007 01/30/22 1016  ?BP: (!) 156/90 (!) 144/92  ?Pulse: 92 95  ?Resp: 20 20  ?Temp:    ?SpO2: 98% 97%  ?  ?Last Pain:  ?Vitals:  ? 01/30/22 1016  ?TempSrc:   ?PainSc: 5   ? ? ?  ?  ?  ?  ?  ?  ? ?Lidia Collum ? ? ? ? ?

## 2022-01-30 NOTE — Anesthesia Procedure Notes (Signed)
Procedure Name: [place] ?Date/Time: 01/30/2022 9:28 AM ?Performed by: Trinna Post., CRNA ?Pre-anesthesia Checklist: Patient identified, Emergency Drugs available, Suction available, Patient being monitored and Timeout performed ?Patient Re-evaluated:Patient Re-evaluated prior to induction ?Oxygen Delivery Method: Nasal cannula ?Preoxygenation: Pre-oxygenation with 100% oxygen ?Induction Type: IV induction ?Placement Confirmation: positive ETCO2 ? ? ? ? ?

## 2022-01-31 DIAGNOSIS — I639 Cerebral infarction, unspecified: Secondary | ICD-10-CM | POA: Diagnosis not present

## 2022-01-31 LAB — PHOSPHATIDYLSERINE ANTIBODIES
Phosphatydalserine, IgA: 2 APS Units (ref 0–19)
Phosphatydalserine, IgG: 9 Units (ref 0–30)
Phosphatydalserine, IgM: <10 Units (ref 0–30)

## 2022-01-31 NOTE — Evaluation (Signed)
Speech Language Pathology Evaluation ?Patient Details ?Name: Krystal Sherman ?MRN: [id number] ?DOB: 05/09/1981 ?Today's Date: 01/31/2022 ?Time: [id number] ?SLP Time Calculation (min) (ACUTE ONLY): 27 min ? ?Problem List:  ?Patient Active Problem List  ? Diagnosis Date Noted  ? Acute ischemic stroke (HCC) 01/29/2022  ? Acute CVA (cerebrovascular accident) (HCC) 01/29/2022  ? ?Past Medical History:  ?Past Medical History:  ?Diagnosis Date  ? Stroke [facility])   ? acute ischemic stroke  ? ?Past Surgical History: History reviewed. No pertinent surgical history. ?HPI:  ?Pt is a 41 y/o female presenting on 3/8 with L sided weakness. MRI (3/9) with extensive acute infarction in R temporoparietal cortex and in superior R frontal cortext, roughly watershed pattern, R occupital cortex infract chronic (but new since last CVA).  PMH includes: CVA with residual R sided weakness, HTN, and polysubstance abuse.  ? ?Assessment / Plan / Recommendation ?Clinical Impression ? Pt presents with mild dysarthria and cognitive communication deficits post CVA. Intelligibility at the word-short phrase level is 75% and marked largely by articulatory imprecision. Improvement note with min verbal cues for over-articulation and increasing of vocal intensity. Immediate and delayed recall of 5 listed items was 100%, with pt demonstrating increased difficulty during short story recall task, suspect an element of reduced sustained attention. Husband, present for eval, reports pt with poor recall of their recent conversations and that this is different from baseline. Problem solving/judgement is also poor with pt 0% accurate in providing appropriate solutions to simple-complex problem solving scenarios related to managment of health conditions. Receptive/expressive language appeared [facility] for tasks provided. Recommend SLP f/u for treatment of motor speech and cognitive functions. Pt and husband, expressed agreement. ?   ?SLP Assessment ? SLP  Recommendation/Assessment: Patient needs continued Speech Lanaguage Pathology Services ?SLP Visit Diagnosis: Cognitive communication deficit (R41.841);Dysarthria and anarthria (R47.1)  ?  ?Recommendations for follow up therapy are one component of a multi-disciplinary discharge planning process, led by the attending physician.  Recommendations may be updated based on patient status, additional functional criteria and insurance authorization. ?   ?Follow Up Recommendations ? Acute inpatient rehab (3hours/day)  ?  ?Assistance Recommended at Discharge ? Frequent or constant Supervision/Assistance  ?Functional Status Assessment Patient has had a recent decline in their functional status and demonstrates the ability to make significant improvements in function in a reasonable and predictable amount of time.  ?Frequency and Duration min 2x/week  ?2 weeks ?  ?   ?SLP Evaluation ?Cognition ? Overall Cognitive Status: Impaired/Different from baseline ?Arousal/Alertness: Awake/alert ?Orientation Level: Oriented to person;Oriented to place;Oriented to situation;Disoriented to time ?Year: 2023 ?Month: March ?Day of Week: Incorrect ?Attention: Sustained ?Sustained Attention: Impaired ?Sustained Attention Impairment: Functional basic;Verbal complex ?Memory: Impaired ?Memory Impairment: Storage deficit;Retrieval deficit;Decreased recall of new information ?Immediate Memory Recall:  (5/5 immediate, 5/5 delayed; 4/8 short story recall; unabel to recall speech strategies previously given) ?Awareness: Impaired ?Awareness Impairment: Intellectual impairment ?Problem Solving: Impaired ?Problem Solving Impairment: Verbal basic;Verbal complex ?Executive Function: Self Correcting;Self Monitoring;Reasoning;Decision Making ?Reasoning: Impaired ?Reasoning Impairment: Verbal basic;Verbal complex ?Decision Making: Impaired ?Decision Making Impairment: Verbal basic;Verbal complex ?Self Monitoring: Impaired ?Self Monitoring Impairment: Verbal  basic ?Self Correcting: Impaired ?Self Correcting Impairment: Verbal basic ?Safety/Judgment: Impaired  ?  ?   ?Comprehension ? Auditory Comprehension ?Overall Auditory Comprehension: Appears within functional limits for tasks assessed ?Yes/No Questions: Within Functional Limits ?Commands: Within Functional Limits ?Conversation: Simple ?Visual Recognition/Discrimination ?Discrimination: Not tested ?Reading Comprehension ?Reading Status: Not tested  ?  ?Expression Verbal Expression ?Overall Verbal Expression: Appears within  functional limits for tasks assessed ?Initiation: No impairment ?Automatic Speech: Name ?Level of Generative/Spontaneous Verbalization: Conversation ?Repetition: No impairment ?Naming: No impairment ?Written Expression ?Dominant Hand: Right ?Written Expression: Not tested   ?Oral / Motor ? Oral Motor/Sensory Function ?Overall Oral Motor/Sensory Function: Mild impairment ?Facial ROM: Reduced left ?Facial Symmetry: Abnormal symmetry left;Suspected CN VII (facial) dysfunction ?Lingual ROM: Within Functional Limits ?Lingual Symmetry: Within Functional Limits ?Motor Speech ?Overall Motor Speech: Impaired ?Respiration: Within functional limits ?Phonation: Normal ?Resonance: Within functional limits ?Articulation: Impaired ?Level of Impairment: Word ?Intelligibility: Intelligibility reduced ?Word: 75-100% accurate ?Phrase: 50-74% accurate ?Sentence: 50-74% accurate ?Conversation: 50-74% accurate ?Motor Planning: Witnin functional limits ?Motor Speech Errors: Unaware   ? ? ?Krystal Echevaria, MA, CCC-SLP ?Acute Rehabilitation Services ?Office Number: [phone] ?        ? ?Krystal Sherman ?01/31/2022, 1:06 PM ? ?

## 2022-01-31 NOTE — Plan of Care (Signed)

## 2022-01-31 NOTE — Progress Notes (Signed)
PROGRESS NOTE        PATIENT DETAILS Name: Krystal Sherman Age: 41 y.o. Sex: female Date of Birth: 08-Feb-1981 Admit Date: 01/28/2022 Admitting Physician Evalee Mutton Kristeen Mans, MD [id number], [facility]  Brief Summary: 41 year old with prior history of CVA, methamphetamine use-presented with left sided weakness-was found to have acute CVA.  See below for further details.  Significant Hospital events: 3/8>> admit to [facility] for left-sided weakness x2 days-found to have acute CVA.  Significant studies: 3/9>> MRI brain: Acute infarction involving right temporoparietal cortex, superior right frontal cortex and watershed pattern. 3/9>> brain MRA: Degraded by motion-advanced intracranial stenosis with a flow gap in the right supraclinoid ICA and left carotid terminus. 3/9>> neck MRA: Negative Motion degraded assessment 3/9>> CT angio head/neck: Carotid artery/vertebral artery normal in the neck.  Irregular stenosis in the cavernous carotid bilaterally, segmental occlusion of left M1 segment, decreased flow in the left MCA branches.  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 3/9>> TTE: EF 55-60%, small pericardial effusion-no evidence of tamponade. 3/9>> A1c: 4.7 3/9>> LDL: 91 3/9>> vitamin B12: 317 3/9>> CRP: 0.9 3/9>> ESR: 17 3/9>> ANA: Pending 3/9>> hypercoagulable work-up: Pending 3/10>> TEE: Positive bubble study consistent with PFO 3/10>> lower extremity Dopplers: No DVT  Significant microbiology data: 3/8>> COVID/influenza PCR: Negative  Procedures: None  Consults:  Neurology  Subjective: Main stable-slight left-sided weakness continues.  Awaiting CIR placement.  Objective: Vitals: Blood pressure (!) 182/111, pulse 96, temperature 98 F (36.7 C), temperature source Oral, resp. rate 18, height '5\' 6"'  (1.676 m), weight 90.8 kg, SpO2 99 %.   Exam: Gen Exam:Alert awake-not in any distress HEENT:atraumatic, normocephalic Chest: B/L clear to auscultation  anteriorly CVS:S1S2 regular Abdomen:soft non tender, non distended Extremities:no edema Neurology: Mild left-sided weakness. 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  Stroke MD recommending aspirin/Plavix for 3 months followed by aspirin alone.  Remains on statin.  Awaiting insurance authorization for CIR.  Hypercoagulable work-up panel pending-please follow  HTN: Initially permissive hypertension was allowed-have started low-dose amlodipine-gradually lower BP over the next few days.  Borderline vitamin B12 deficiency: Begin parenteral supplementation-place on oral supplementation on discharge.  Ongoing methamphetamine use: Claims to smoke methamphetamine daily-counseled repeatedly.  History of cocaine use: Claims she does not do cocaine any longer-last use was several months back.   BMI: Estimated body mass index is 32.31 kg/m as calculated from the following:   Height as of this encounter: '5\' 6"'  (1.676 m).   Weight as of this encounter: 90.8 kg.   Code status:   Code Status: Full Code   DVT Prophylaxis: enoxaparin (LOVENOX) injection 40 mg Start: 01/29/22 1200 SCDs Start: 01/29/22 0123   Family Communication: Significant other at bedside on 3/11   Disposition Plan: Status is: Observation The patient will require care spanning > 2 midnights and should be moved  to inpatient because: Await CIR placement.   Planned Discharge Destination: CIR   Diet: Diet Order             Diet Heart Room  service appropriate? Yes; Fluid consistency: Thin  Diet effective now                     Antimicrobial agents: Anti-infectives (From admission, onward)    None        MEDICATIONS: Scheduled Meds:   stroke: mapping our early stages of recovery book   Does not apply Once   amLODipine  5 mg Oral Daily   aspirin EC  81 mg Oral Daily   atorvastatin  40 mg Oral Daily   clopidogrel  75 mg Oral Daily   cyanocobalamin  1,000 mcg Subcutaneous Daily   enoxaparin (LOVENOX) injection  40 mg Subcutaneous Q24H   Continuous Infusions: PRN Meds:.acetaminophen **OR** acetaminophen, clonazePAM, hydrALAZINE, labetalol   I have personally reviewed following labs and imaging studies  LABORATORY DATA: CBC: Recent Labs  Lab 01/28/22 2350 01/29/22 0915  WBC 9.1 7.8  NEUTROABS 5.6  --   HGB 13.2 13.3  HCT 40.6 40.7  MCV 84.6 85.3  PLT 218 229     Basic Metabolic Panel: Recent Labs  Lab 01/28/22 2350 01/29/22 0915  NA 137 135  K 3.4* 3.7  CL 104 103  CO2 23 24  GLUCOSE 112* 149*  BUN 9 9  CREATININE 0.90 0.86  CALCIUM 8.8* 8.7*     GFR: Estimated Creatinine Clearance: 99.7 mL/min (by C-G formula based on SCr of 0.86 mg/dL).  Liver Function Tests: Recent Labs  Lab 01/28/22 2350  AST 19  ALT 16  ALKPHOS 100  BILITOT 0.4  PROT 6.5  ALBUMIN 3.3*    No results for input(s): LIPASE, AMYLASE in the last 168 hours. No results for input(s): AMMONIA in the last 168 hours.  Coagulation Profile: Recent Labs  Lab 01/28/22 2350  INR 0.9     Cardiac Enzymes: No results for input(s): CKTOTAL, CKMB, CKMBINDEX, TROPONINI in the last 168 hours.  BNP (last 3 results) No results for input(s): PROBNP in the last 8760 hours.  Lipid Profile: Recent Labs    01/29/22 0437  CHOL 155  HDL 32*  LDLCALC 91  TRIG 160*  CHOLHDL 4.8     Thyroid Function Tests: Recent Labs    01/29/22 0915  TSH 3.436     Anemia Panel: Recent Labs    01/29/22 0915   VITAMINB12 317     Urine analysis:    Component Value Date/Time   COLORURINE YELLOW 01/29/2022 0205   APPEARANCEUR HAZY (A) 01/29/2022 0205   LABSPEC 1.019 01/29/2022 0205   PHURINE 6.0 01/29/2022 0205   GLUCOSEU NEGATIVE 01/29/2022 0205   HGBUR NEGATIVE 01/29/2022 0205   BILIRUBINUR NEGATIVE 01/29/2022 0205   KETONESUR NEGATIVE 01/29/2022 0205   PROTEINUR NEGATIVE 01/29/2022 0205   NITRITE NEGATIVE 01/29/2022 0205   LEUKOCYTESUR TRACE (A) 01/29/2022 0205    Sepsis Labs: Lactic Acid, Venous No results found for: LATICACIDVEN  MICROBIOLOGY: Recent Results (from the past 240 hour(s))  Resp Panel by RT-PCR (Flu A&B, Covid) Nasopharyngeal Swab     Status: None   Collection Time: 01/28/22 11:44 PM   Specimen: Nasopharyngeal Swab; Nasopharyngeal(NP) swabs in vial transport medium  Result Value Ref Range Status   SARS Coronavirus 2 by RT PCR NEGATIVE NEGATIVE Final    Comment: (NOTE) SARS-CoV-2 target nucleic acids are NOT DETECTED.  The  SARS-CoV-2 RNA is generally detectable in upper respiratory specimens during the acute phase of infection. The lowest concentration of SARS-CoV-2 viral copies this assay can detect is 138 copies/mL. A negative result does not preclude SARS-Cov-2 infection and should not be used as the sole basis for treatment or other patient management decisions. A negative result may occur with  improper specimen collection/handling, submission of specimen other than nasopharyngeal swab, presence of viral mutation(s) within the areas targeted by this assay, and inadequate number of viral copies(<138 copies/mL). A negative result must be combined with clinical observations, patient history, and epidemiological information. The expected result is Negative.  Fact Sheet for Patients:  EntrepreneurPulse.com.au  Fact Sheet for Healthcare Providers:  IncredibleEmployment.be  This test is no t yet approved or cleared by the  [place] FDA and  has been authorized for detection and/or diagnosis of SARS-CoV-2 by FDA under an Emergency Use Authorization (EUA). This EUA will remain  in effect (meaning this test can be used) for the duration of the COVID-19 declaration under Section 564(b)(1) of the Act, 21 U.S.C.section 360bbb-3(b)(1), unless the authorization is terminated  or revoked sooner.       Influenza A by PCR NEGATIVE NEGATIVE Final   Influenza B by PCR NEGATIVE NEGATIVE Final    Comment: (NOTE) The Xpert Xpress SARS-CoV-2/FLU/RSV plus assay is intended as an aid in the diagnosis of influenza from Nasopharyngeal swab specimens and should not be used as a sole basis for treatment. Nasal washings and aspirates are unacceptable for Xpert Xpress SARS-CoV-2/FLU/RSV testing.  Fact Sheet for Patients: EntrepreneurPulse.com.au  Fact Sheet for Healthcare Providers: IncredibleEmployment.be  This test is not yet approved or cleared by the [place] FDA and has been authorized for detection and/or diagnosis of SARS-CoV-2 by FDA under an Emergency Use Authorization (EUA). This EUA will remain in effect (meaning this test can be used) for the duration of the COVID-19 declaration under Section 564(b)(1) of the Act, 21 U.S.C. section 360bbb-3(b)(1), unless the authorization is terminated or revoked.  Performed at [facility], [place] [address] [place], [place] [address]     RADIOLOGY STUDIES/RESULTS: VAS [place] TRANSCRANIAL DOPPLER W BUBBLES  Result Date: 01/30/2022  Transcranial Doppler with Bubble Patient Name:  JOSELYNN AMOROSO  Date of Exam:   01/30/2022 Medical Rec #: [id number]       Accession #:    [id number] Date of Birth: 10/29/1981       Patient Gender: F Patient Age:   66 years Exam Location:  [facility] Procedure:      VAS [place] TRANSCRANIAL DOPPLER W BUBBLES Referring Phys: Cornelius Moras XU  --------------------------------------------------------------------------------  Indications: Stroke. Comparison Study: No prior studies. Performing Technologist: Darlin Coco RDMS, RVT  Examination Guidelines: A complete evaluation includes B-mode imaging, spectral Doppler, color Doppler, and power Doppler as needed of all accessible portions of each vessel. Bilateral testing is considered an integral part of a complete examination. Limited examinations for reoccurring indications may be performed as noted.  Summary:  A vascular evaluation was performed. The right middle cerebral artery was studied. An IV was inserted into the patient's left forearm. Verbal informed consent was obtained.  No high intensity transient signals (HITS) were observed at rest. Greater than ten high intensity transient signals (HITS) were observed with valsalva, indicating a Spencer grade 1-2 patent foramen ovale (PFO) with valsalva. *See table(s) above for TCD measurements and observations.    Preliminary    ECHO TEE  Result Date: 01/30/2022    TRANSESOPHOGEAL  ECHO REPORT   Patient Name:   ALANII RAMER Date of Exam: 01/30/2022 Medical Rec #:  [id number]      Height:       66.0 in Accession #:    [id number]     Weight:       180.0 lb Date of Birth:  1981/03/14      BSA:          1.912 m Patient Age:    67 years       BP:           112/72 mmHg Patient Gender: F              HR:           101 bpm. Exam Location:  Inpatient Procedure: Transesophageal Echo, Color Doppler and Cardiac Doppler Indications:     stroke  History:         Patient has prior history of Echocardiogram examinations, most                  recent 01/29/2022.  Sonographer:     Johny Chess RDCS Referring Phys:  [id number] Darreld Mclean Diagnosing Phys: Oswaldo Milian MD PROCEDURE: After discussion of the risks and benefits of a TEE, an informed consent was obtained from the patient. The transesophogeal probe was passed without difficulty through the esophogus  of the patient. Imaged were obtained with the patient in a left lateral decubitus position. Sedation performed by different physician. The patient was monitored while under deep sedation. Anesthestetic sedation was provided intravenously by Anesthesiology: 380m of Propofol. The patient developed no complications during the procedure. IMPRESSIONS  1. Left ventricular ejection fraction, by estimation, is 60 to 65%. The left ventricle has normal function. The left ventricle has no regional wall motion abnormalities. There is severe left ventricular hypertrophy.  2. Right ventricular systolic function is normal. The right ventricular size is normal.  3. No left atrial/left atrial appendage thrombus was detected.  4. The mitral valve is normal in structure. Trivial mitral valve regurgitation.  5. The aortic valve is tricuspid. Aortic valve regurgitation is not visualized. No aortic stenosis is present.  6. Agitated saline contrast bubble study was positive with shunting observed within 3-6 cardiac cycles suggestive of interatrial shunt. FINDINGS  Left Ventricle: Left ventricular ejection fraction, by estimation, is 60 to 65%. The left ventricle has normal function. The left ventricle has no regional wall motion abnormalities. The left ventricular internal cavity size was normal in size. There is  severe left ventricular hypertrophy. Right Ventricle: The right ventricular size is normal. No increase in right ventricular wall thickness. Right ventricular systolic function is normal. Left Atrium: Left atrial size was normal in size. No left atrial/left atrial appendage thrombus was detected. Right Atrium: Right atrial size was normal in size. Pericardium: There is no evidence of pericardial effusion. Mitral Valve: The mitral valve is normal in structure. Trivial mitral valve regurgitation. Tricuspid Valve: The tricuspid valve is normal in structure. Tricuspid valve regurgitation is trivial. Aortic Valve: The aortic valve  is tricuspid. Aortic valve regurgitation is not visualized. No aortic stenosis is present. Pulmonic Valve: The pulmonic valve was grossly normal. Pulmonic valve regurgitation is not visualized. Aorta: The aortic root and ascending aorta are structurally normal, with no evidence of dilitation. IAS/Shunts: No atrial level shunt detected by color flow Doppler. Agitated saline contrast was given intravenously to evaluate for intracardiac shunting. Agitated saline contrast bubble study was positive with shunting observed within 3-6  cardiac cycles suggestive of interatrial shunt. Oswaldo Milian MD Electronically signed by Oswaldo Milian MD Signature Date/Time: 01/30/2022/3:30:01 PM    Final    VAS [place] LOWER EXTREMITY VENOUS (DVT)  Result Date: 01/30/2022  Lower Venous DVT Study Patient Name:  DIASHA CASTLEMAN  Date of Exam:   01/30/2022 Medical Rec #: [id number]       Accession #:    [id number] Date of Birth: February 07, 1981       Patient Gender: F Patient Age:   71 years Exam Location:  [facility] Procedure:      VAS [place] LOWER EXTREMITY VENOUS (DVT) Referring Phys: Oren Binet --------------------------------------------------------------------------------  Indications: Embolic stroke.  Comparison Study: No prior studies. Performing Technologist: Darlin Coco RDMS, RVT  Examination Guidelines: A complete evaluation includes B-mode imaging, spectral Doppler, color Doppler, and power Doppler as needed of all accessible portions of each vessel. Bilateral testing is considered an integral part of a complete examination. Limited examinations for reoccurring indications may be performed as noted. The reflux portion of the exam is performed with the patient in reverse Trendelenburg.  +--------+---------------+---------+-----------+----------+--------------------+  RIGHT    Compressibility Phasicity Spontaneity Properties Thrombus Aging         +--------+---------------+---------+-----------+----------+--------------------+  CFV      Full            Yes       Yes                                          +--------+---------------+---------+-----------+----------+--------------------+  SFJ      Full                                                                   +--------+---------------+---------+-----------+----------+--------------------+  FV Prox  Full                                                                   +--------+---------------+---------+-----------+----------+--------------------+  FV Mid   Full                                                                   +--------+---------------+---------+-----------+----------+--------------------+  FV       Full                                             Duplicated- both       Distal  patent                +--------+---------------+---------+-----------+----------+--------------------+  PFV      Full                                                                   +--------+---------------+---------+-----------+----------+--------------------+  POP      Full            Yes       Yes                    Duplicated- both                                                                 patent                +--------+---------------+---------+-----------+----------+--------------------+  PTV      Full                                                                   +--------+---------------+---------+-----------+----------+--------------------+  PERO     Full                                                                   +--------+---------------+---------+-----------+----------+--------------------+   +---------+---------------+---------+-----------+----------+--------------+  LEFT      Compressibility Phasicity Spontaneity Properties Thrombus Aging  +---------+---------------+---------+-----------+----------+--------------+  CFV       Full             Yes       Yes                                    +---------+---------------+---------+-----------+----------+--------------+  SFJ       Full                                                             +---------+---------------+---------+-----------+----------+--------------+  FV Prox   Full                                                             +---------+---------------+---------+-----------+----------+--------------+  FV Mid    Full                                                             +---------+---------------+---------+-----------+----------+--------------+  FV Distal Full                                                             +---------+---------------+---------+-----------+----------+--------------+  PFV       Full                                                             +---------+---------------+---------+-----------+----------+--------------+  POP       Full            Yes       Yes                                    +---------+---------------+---------+-----------+----------+--------------+  PTV       Full                                                             +---------+---------------+---------+-----------+----------+--------------+  PERO      Full                                                             +---------+---------------+---------+-----------+----------+--------------+  Gastroc   Full                                                             +---------+---------------+---------+-----------+----------+--------------+     Summary: RIGHT: - There is no evidence of deep vein thrombosis in the lower extremity.  - No cystic structure found in the popliteal fossa.  LEFT: - There is no evidence of deep vein thrombosis in the lower extremity.  - No cystic structure found in the popliteal fossa.  *See table(s) above for measurements and observations. Electronically signed by Monica Martinez MD on 01/30/2022 at 5:21:26 PM.    Final      LOS: 2 days   Oren Binet, MD  [facility]    To contact the attending provider between 7A-7P or the covering provider during after hours 7P-7A, please log into the web site [email or web address] and access using universal [place] password for that web site. If you do not have the password, please call the hospital operator.  01/31/2022, 1:42 PM

## 2022-01-31 NOTE — Progress Notes (Signed)
Physical Therapy Treatment ?Patient Details ?Name: Krystal Sherman ?MRN: [id number] ?DOB: 01/19/1981 ?Today's Date: 01/31/2022 ? ? ?History of Present Illness Pt is a 41 y/o female presenting on 3/8 with L sided weakness. MRI with extensive acute infarction in R temporoparietal cortex and in superior R frontal cortext, roughly watershed pattern, R occupital cortex infract chronic (but new since last CVA).  PMH includes: CVA with residual R sided weakness, HTN, and polysubstance abuse. ? ?  ?PT Comments  ? ? Pt was flat and at times tearful.   Emphasis on transitions, sit to stand safety, standing balance and progression of gait with visual compensation plus challenging with wayfinding. ?  ?Recommendations for follow up therapy are one component of a multi-disciplinary discharge planning process, led by the attending physician.  Recommendations may be updated based on patient status, additional functional criteria and insurance authorization. ? ?Follow Up Recommendations ? Acute inpatient rehab (3hours/day) ?  ?  ?Assistance Recommended at Discharge Frequent or constant Supervision/Assistance  ?Patient can return home with the following A little help with walking and/or transfers;A little help with bathing/dressing/bathroom;Assistance with cooking/housework;Assist for transportation;Help with stairs or ramp for entrance ?  ?Equipment Recommendations ? Other (comment) (TBA)  ?  ?Recommendations for Other Services   ? ? ?  ?Precautions / Restrictions Precautions ?Precautions: Fall ?Precaution Comments: monitor BP  ?  ? ?Mobility ? Bed Mobility ?Overal bed mobility: Needs Assistance ?Bed Mobility: Supine to Sit, Sit to Supine ?  ?  ?Supine to sit: Min guard ?Sit to supine: Min guard ?  ?  ?  ? ?Transfers ?Overall transfer level: Needs assistance ?Equipment used: None ?Transfers: Sit to/from Stand ?Sit to Stand: Min assist ?Stand pivot transfers: Min assist ?  ?  ?  ?  ?General transfer comment: repetitive cuing for visual  deficit, min stability at times ?  ? ?Ambulation/Gait ?Ambulation/Gait assistance: Min assist ?Gait Distance (Feet): 105 Feet ?Assistive device: None ?Gait Pattern/deviations: Step-through pattern ?  ?Gait velocity interpretation: <1.8 ft/sec, indicate of risk for recurrent falls ?  ?General Gait Details: general stability, occasional deviation with scanning.  VSS except BP pre/post amb  180/105 and 187/118, respectively.  Pt getting lost on the unit and having trouble being aware of and then seeing obstacles on L given almost complete L hemianopsia. ? ? ?Stairs ?  ?  ?  ?  ?  ? ? ?Wheelchair Mobility ?  ? ?Modified Rankin (Stroke Patients Only) ?Modified Rankin (Stroke Patients Only) ?Pre-Morbid Rankin Score: No significant disability ?Modified Rankin: Moderate disability ? ? ?  ?Balance Overall balance assessment: Needs assistance ?  ?Sitting balance-Leahy Scale: Fair (to good) ?Sitting balance - Comments: guard for safety ?  ?  ?Standing balance-Leahy Scale: Fair ?Standing balance comment: No assist needed, just guard for safety.  Used BERG activity to challenge balance at EOB ?  ?  ?  ?  ?  ?  ?  ?  ?Standardized Balance Assessment ?Standardized Balance Assessment : Sharlene Motts Balance Test ?Sharlene Motts Balance Test ?Sit to Stand: Able to stand  independently using hands ?Standing Unsupported: Able to stand 2 minutes with supervision ?Sitting with Back Unsupported but Feet Supported on Floor or Stool: Able to sit 2 minutes under supervision ?Stand to Sit: Controls descent by using hands ?Transfers: Able to transfer safely, definite need of hands ?Standing Unsupported with Eyes Closed: Able to stand 10 seconds safely ?Standing Ubsupported with Feet Together: Able to place feet together independently and stand for 1 minute with supervision ?From  Standing, Reach Forward with Outstretched Arm: Can reach confidently >25 cm (10") ?  ?  ? ?  ?Cognition Arousal/Alertness: Awake/alert ?Behavior During Therapy: Flat affect  (tearful) ?Overall Cognitive Status: Impaired/Different from baseline ?  ?  ?  ?  ?  ?  ?  ?  ?  ?  ?Current Attention Level: Sustained ?  ?Following Commands: Follows one step commands consistently, Follows multi-step commands inconsistently ?Safety/Judgement: Decreased awareness of safety, Decreased awareness of deficits ?Awareness: Emergent ?  ?  ?  ?  ? ?  ?Exercises   ? ?  ?General Comments General comments (skin integrity, edema, etc.): notable inattention to the L, but as session progressed, pt became more aware of her environment on the L or how much info she might be missing..  Used way finding and visual compensation during the session. ?  ?  ? ?Pertinent Vitals/Pain Pain Assessment ?Pain Assessment: Faces ?Faces Pain Scale: Hurts little more ?Pain Location: L side of abdomen ?Pain Descriptors / Indicators: Cramping ?Pain Intervention(s): Monitored during session  ? ? ?Home Living Family/patient expects to be discharged to:: Private residence ?Living Arrangements: Spouse/significant other ?Available Help at Discharge: Family;Available 24 hours/day ?Type of Home: Homeless ?  ?  ?  ?  ?  ?  ?   ?  ?Prior Function    ?  ?  ?   ? ?PT Goals (current goals can now be found in the care plan section) Acute Rehab PT Goals ?PT Goal Formulation: Patient unable to participate in goal setting ?Time For Goal Achievement: 02/12/22 ?Potential to Achieve Goals: Good ?Progress towards PT goals: Progressing toward goals ? ?  ?Frequency ? ? ? Min 4X/week ? ? ? ?  ?PT Plan Current plan remains appropriate  ? ? ?Co-evaluation   ?  ?  ?  ?  ? ?  ?AM-PAC PT "6 Clicks" Mobility   ?Outcome Measure ? Help needed turning from your back to your side while in a flat bed without using bedrails?: A Little ?Help needed moving from lying on your back to sitting on the side of a flat bed without using bedrails?: A Little ?Help needed moving to and from a bed to a chair (including a wheelchair)?: A Little ?Help needed standing up from a  chair using your arms (e.g., wheelchair or bedside chair)?: A Little ?Help needed to walk in hospital room?: A Little ?  ?6 Click Score: 15 ? ?  ?End of Session   ?Activity Tolerance: Patient tolerated treatment well ?Patient left: in bed;with call bell/phone within reach;with bed alarm set;with family/visitor present ?Nurse Communication: Mobility status ?PT Visit Diagnosis: Unsteadiness on feet (R26.81);Difficulty in walking, not elsewhere classified (R26.2);Other symptoms and signs involving the nervous system (R29.898) ?  ? ? ?Time: 1660-6301 ?PT Time Calculation (min) (ACUTE ONLY): 35 min ? ?Charges:  $Gait Training: 8-22 mins ?$Therapeutic Activity: 8-22 mins          ?          ? ?01/31/2022 ? ?Jacinto Halim., PT ?Acute Rehabilitation Services ?[phone]  (pager) ?[phone]  (office) ? ? ?Eliseo Gum Elis Rawlinson ?01/31/2022, 2:59 PM ? ?

## 2022-01-31 NOTE — Progress Notes (Signed)
Inpatient Rehab Admissions: ? ?Inpatient Rehab Consult received.  I met with patient at the bedside for rehabilitation assessment and to discuss goals and expectations of an inpatient rehab admission.  Pt acknowledged understanding of CIR goals and expectations. Pt interested in pursuing CIR. Pt gave permission to contact husband, Phillips Odor. The line was busy; was not able to talk with him. Will discuss pt's dispo with physiatrist. Will continue to follow. ? ?Signed: ?Gayland Curry, MS, CCC-SLP ?Admissions Coordinator ?[phone] ? ? ?

## 2022-02-01 DIAGNOSIS — I639 Cerebral infarction, unspecified: Secondary | ICD-10-CM | POA: Diagnosis not present

## 2022-02-01 MED ORDER — AMLODIPINE BESYLATE 10 MG PO TABS
10.0000 mg | ORAL_TABLET | Freq: Every day | ORAL | Status: DC
  Administered 2022-02-01 – 2022-02-04 (×4): 10 mg via ORAL
  Filled 2022-02-01 (×5): qty 1

## 2022-02-01 NOTE — Progress Notes (Signed)
PROGRESS NOTE    Krystal Sherman  QQV:[id number] DOB: 10-29-1981 DOA: 01/28/2022 PCP: [facility]     Brief Narrative:  Krystal Sherman is a 41 year old with prior history of CVA, methamphetamine use-presented with left sided weakness-was found to have acute CVA.  Significant Hospital events: 3/8>> admit to [facility] for left-sided weakness x2 days-found to have acute CVA.   Significant studies: 3/9>> MRI brain: Acute infarction involving right temporoparietal cortex, superior right frontal cortex and watershed pattern. 3/9>> brain MRA: Degraded by motion-advanced intracranial stenosis with a flow gap in the right supraclinoid ICA and left carotid terminus. 3/9>> neck MRA: Negative Motion degraded assessment 3/9>> CT angio head/neck: Carotid artery/vertebral artery normal in the neck.  Irregular stenosis in the cavernous carotid bilaterally, segmental occlusion of left M1 segment, decreased flow in the left MCA branches.  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 3/9>> TTE: EF 55-60%, small pericardial effusion-no evidence of tamponade. 3/9>> A1c: 4.7 3/9>> LDL: 91 3/9>> vitamin B12: 317 3/9>> CRP: 0.9 3/9>> ESR: 17 3/9>> ANA: Pending 3/9>> hypercoagulable work-up: Pending 3/10>> TEE: Positive bubble study consistent with PFO 3/10>> lower extremity Dopplers: No DVT   Significant microbiology data: 3/8>> COVID/influenza PCR: Negative  New events last 24 hours / Subjective: Patient arousable to verbal stimuli, does not participate in exam, no acute complaints or events reported overnight  Assessment & Plan:   Principal Problem:   Acute ischemic stroke [facility]) Active Problems:   Acute CVA (cerebrovascular accident) ([place])   Acute CVA: right MCA scattered infarct due to right ICA siphon stenosis likely secondary large vessel source in the setting of uncontrolled hypertension and stimulant abuse.  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 Hypercoagulable work-up is pending.  Continue aspirin/Plavix  for 3 months then transition to aspirin alone.  Continue statin.  CIR recommended and placement is pending.  Follow-up with stroke clinic in 4 weeks.   HTN: Amlodipine   Borderline vitamin B12 deficiency: Begin parenteral supplementation-place on oral supplementation on discharge.   Ongoing methamphetamine use: Cessation counseling   History of cocaine use: Claims she does not do cocaine any longer-last use was several months back.    BMI: Estimated body mass index is 32.31 kg/m as calculated from the following:   Height as of this encounter: 5' 6" (1.676 m).   Weight as of this encounter: 90.8 kg.      DVT prophylaxis:  enoxaparin (LOVENOX) injection 40 mg Start: 01/29/22 1200 SCDs Start: 01/29/22 0123  Code Status: Full code Family Communication: No family at bedside Disposition Plan:  Status is: Inpatient Remains inpatient appropriate because: Pending CIR placement  Antimicrobials:  Anti-infectives (From admission, onward)    None        Objective: Vitals:   01/31/22 2305 02/01/22 0302 02/01/22 0326 02/01/22 0755  BP: (!) 163/90  (!) 171/98 (!) 176/105  Pulse: 71  80 82  Resp: _0 Temp: 97.8 F (36.6 C)  97.8 F (36.6 C) 98.2 F (36.8 C)  TempSrc: Oral  Oral Oral  SpO2: 95%  94% 95%  Weight:  99.3 kg    Height:        Intake/Output Summary (Last 24 hours) at 02/01/2022 1049 Last data filed at 01/31/2022 1600 Gross per 24 hour  Intake 480 ml  Output --  Net 480 ml   Filed Weights   01/29/22 1755 01/31/22 0500 02/01/22 0302  Weight: 90.7 kg 90.8 kg 99.3 kg    Examination:  General exam: Appears calm and comfortable  Data Reviewed: I have personally reviewed following labs and imaging studies  CBC: Recent Labs  Lab 01/28/22 2350 01/29/22 0915  WBC 9.1 7.8  NEUTROABS 5.6  --   HGB 13.2 13.3  HCT 40.6 40.7  MCV 84.6 85.3  PLT 218 272   Basic Metabolic Panel: Recent Labs  Lab 01/28/22 2350 01/29/22 0915  NA 137 135  K 3.4*  3.7  CL 104 103  CO2 23 24  GLUCOSE 112* 149*  BUN 9 9  CREATININE 0.90 0.86  CALCIUM 8.8* 8.7*   GFR: Estimated Creatinine Clearance: 104.4 mL/min (by C-G formula based on SCr of 0.86 mg/dL). Liver Function Tests: Recent Labs  Lab 01/28/22 2350  AST 19  ALT 16  ALKPHOS 100  BILITOT 0.4  PROT 6.5  ALBUMIN 3.3*   No results for input(s): LIPASE, AMYLASE in the last 168 hours. No results for input(s): AMMONIA in the last 168 hours. Coagulation Profile: Recent Labs  Lab 01/28/22 2350  INR 0.9   Cardiac Enzymes: No results for input(s): CKTOTAL, CKMB, CKMBINDEX, TROPONINI in the last 168 hours. BNP (last 3 results) No results for input(s): PROBNP in the last 8760 hours. HbA1C: No results for input(s): HGBA1C in the last 72 hours. CBG: No results for input(s): GLUCAP in the last 168 hours. Lipid Profile: No results for input(s): CHOL, HDL, LDLCALC, TRIG, CHOLHDL, LDLDIRECT in the last 72 hours. Thyroid Function Tests: No results for input(s): TSH, T4TOTAL, FREET4, T3FREE, THYROIDAB in the last 72 hours. Anemia Panel: No results for input(s): VITAMINB12, FOLATE, FERRITIN, TIBC, IRON, RETICCTPCT in the last 72 hours. Sepsis Labs: No results for input(s): PROCALCITON, LATICACIDVEN in the last 168 hours.  Recent Results (from the past 240 hour(s))  Resp Panel by RT-PCR (Flu A&B, Covid) Nasopharyngeal Swab     Status: None   Collection Time: 01/28/22 11:44 PM   Specimen: Nasopharyngeal Swab; Nasopharyngeal(NP) swabs in vial transport medium  Result Value Ref Range Status   SARS Coronavirus 2 by RT PCR NEGATIVE NEGATIVE Final    Comment: (NOTE) SARS-CoV-2 target nucleic acids are NOT DETECTED.  The SARS-CoV-2 RNA is generally detectable in upper respiratory specimens during the acute phase of infection. The lowest concentration of SARS-CoV-2 viral copies this assay can detect is 138 copies/mL. A negative result does not preclude SARS-Cov-2 infection and should not be  used as the sole basis for treatment or other patient management decisions. A negative result may occur with  improper specimen collection/handling, submission of specimen other than nasopharyngeal swab, presence of viral mutation(s) within the areas targeted by this assay, and inadequate number of viral copies(<138 copies/mL). A negative result must be combined with clinical observations, patient history, and epidemiological information. The expected result is Negative.  Fact Sheet for Patients:  EntrepreneurPulse.com.au  Fact Sheet for Healthcare Providers:  IncredibleEmployment.be  This test is no t yet approved or cleared by the [place] FDA and  has been authorized for detection and/or diagnosis of SARS-CoV-2 by FDA under an Emergency Use Authorization (EUA). This EUA will remain  in effect (meaning this test can be used) for the duration of the COVID-19 declaration under Section 564(b)(1) of the Act, 21 U.S.C.section 360bbb-3(b)(1), unless the authorization is terminated  or revoked sooner.       Influenza A by PCR NEGATIVE NEGATIVE Final   Influenza B by PCR NEGATIVE NEGATIVE Final    Comment: (NOTE) The Xpert Xpress SARS-CoV-2/FLU/RSV plus assay is intended as an aid in the diagnosis of influenza from  Nasopharyngeal swab specimens and should not be used as a sole basis for treatment. Nasal washings and aspirates are unacceptable for Xpert Xpress SARS-CoV-2/FLU/RSV testing.  Fact Sheet for Patients: EntrepreneurPulse.com.au  Fact Sheet for Healthcare Providers: IncredibleEmployment.be  This test is not yet approved or cleared by the [place] FDA and has been authorized for detection and/or diagnosis of SARS-CoV-2 by FDA under an Emergency Use Authorization (EUA). This EUA will remain in effect (meaning this test can be used) for the duration of the COVID-19 declaration under Section  564(b)(1) of the Act, 21 U.S.C. section 360bbb-3(b)(1), unless the authorization is terminated or revoked.  Performed at [facility], [place] [address], [place], [place] [address]       Radiology Studies: VAS [place] TRANSCRANIAL DOPPLER W BUBBLES  Result Date: 01/30/2022  Transcranial Doppler with Bubble Patient Name:  JEARLEAN DEMAURO  Date of Exam:   01/30/2022 Medical Rec #: [id number]       Accession #:    [id number] Date of Birth: 09-19-1981       Patient Gender: F Patient Age:   20 years Exam Location:  [facility] Procedure:      VAS [place] TRANSCRANIAL DOPPLER W BUBBLES Referring Phys: Cornelius Moras XU --------------------------------------------------------------------------------  Indications: Stroke. Comparison Study: No prior studies. Performing Technologist: Darlin Coco RDMS, RVT  Examination Guidelines: A complete evaluation includes B-mode imaging, spectral Doppler, color Doppler, and power Doppler as needed of all accessible portions of each vessel. Bilateral testing is considered an integral part of a complete examination. Limited examinations for reoccurring indications may be performed as noted.  Summary:  A vascular evaluation was performed. The right middle cerebral artery was studied. An IV was inserted into the patient's left forearm. Verbal informed consent was obtained.  No high intensity transient signals (HITS) were observed at rest. Greater than ten high intensity transient signals (HITS) were observed with valsalva, indicating a Spencer grade 1-2 patent foramen ovale (PFO) with valsalva. *See table(s) above for TCD measurements and observations.    Preliminary    VAS [place] LOWER EXTREMITY VENOUS (DVT)  Result Date: 01/30/2022  Lower Venous DVT Study Patient Name:  JAQUILA SANTELLI  Date of Exam:   01/30/2022 Medical Rec #: [id number]       Accession #:    [id number] Date of Birth: [birth year]       Patient Gender: F Patient Age:   9 years Exam Location:  [facility] Procedure:       VAS [place] LOWER EXTREMITY VENOUS (DVT) Referring Phys: Oren Binet --------------------------------------------------------------------------------  Indications: Embolic stroke.  Comparison Study: No prior studies. Performing Technologist: Darlin Coco RDMS, RVT  Examination Guidelines: A complete evaluation includes B-mode imaging, spectral Doppler, color Doppler, and power Doppler as needed of all accessible portions of each vessel. Bilateral testing is considered an integral part of a complete examination. Limited examinations for reoccurring indications may be performed as noted. The reflux portion of the exam is performed with the patient in reverse Trendelenburg.  +--------+---------------+---------+-----------+----------+--------------------+  RIGHT    Compressibility Phasicity Spontaneity Properties Thrombus Aging        +--------+---------------+---------+-----------+----------+--------------------+  CFV      Full            Yes       Yes                                          +--------+---------------+---------+-----------+----------+--------------------+  SFJ      Full                                                                   +--------+---------------+---------+-----------+----------+--------------------+  FV Prox  Full                                                                   +--------+---------------+---------+-----------+----------+--------------------+  FV Mid   Full                                                                   +--------+---------------+---------+-----------+----------+--------------------+  FV       Full                                             Duplicated- both       Distal                                                    patent                +--------+---------------+---------+-----------+----------+--------------------+  PFV      Full                                                                    +--------+---------------+---------+-----------+----------+--------------------+  POP      Full            Yes       Yes                    Duplicated- both                                                                 patent                +--------+---------------+---------+-----------+----------+--------------------+  PTV      Full                                                                   +--------+---------------+---------+-----------+----------+--------------------+  PERO     Full                                                                   +--------+---------------+---------+-----------+----------+--------------------+   +---------+---------------+---------+-----------+----------+--------------+  LEFT      Compressibility Phasicity Spontaneity Properties Thrombus Aging  +---------+---------------+---------+-----------+----------+--------------+  CFV       Full            Yes       Yes                                    +---------+---------------+---------+-----------+----------+--------------+  SFJ       Full                                                             +---------+---------------+---------+-----------+----------+--------------+  FV Prox   Full                                                             +---------+---------------+---------+-----------+----------+--------------+  FV Mid    Full                                                             +---------+---------------+---------+-----------+----------+--------------+  FV Distal Full                                                             +---------+---------------+---------+-----------+----------+--------------+  PFV       Full                                                             +---------+---------------+---------+-----------+----------+--------------+  POP       Full            Yes       Yes                                    +---------+---------------+---------+-----------+----------+--------------+  PTV        Full                                                             +---------+---------------+---------+-----------+----------+--------------+  PERO      Full                                                             +---------+---------------+---------+-----------+----------+--------------+  Gastroc   Full                                                             +---------+---------------+---------+-----------+----------+--------------+     Summary: RIGHT: - There is no evidence of deep vein thrombosis in the lower extremity.  - No cystic structure found in the popliteal fossa.  LEFT: - There is no evidence of deep vein thrombosis in the lower extremity.  - No cystic structure found in the popliteal fossa.  *See table(s) above for measurements and observations. Electronically signed by Monica Martinez MD on 01/30/2022 at 5:21:26 PM.    Final       Scheduled Meds:   stroke: mapping our early stages of recovery book   Does not apply Once   amLODipine  10 mg Oral Daily   aspirin EC  81 mg Oral Daily   atorvastatin  40 mg Oral Daily   clopidogrel  75 mg Oral Daily   cyanocobalamin  1,000 mcg Subcutaneous Daily   enoxaparin (LOVENOX) injection  40 mg Subcutaneous Q24H   Continuous Infusions:   LOS: 3 days     Dessa Phi, DO [facility] 02/01/2022, 10:49 AM   Available via Epic secure chat 7am-7pm After these hours, please refer to coverage provider listed on [email or web address]

## 2022-02-01 NOTE — Plan of Care (Signed)

## 2022-02-02 ENCOUNTER — Encounter (HOSPITAL_COMMUNITY): Payer: Self-pay | Admitting: Cardiology

## 2022-02-02 DIAGNOSIS — I639 Cerebral infarction, unspecified: Secondary | ICD-10-CM | POA: Diagnosis not present

## 2022-02-02 LAB — PROTHROMBIN GENE MUTATION

## 2022-02-02 LAB — FACTOR 5 LEIDEN

## 2022-02-02 MED ORDER — NICOTINE 14 MG/24HR TD PT24
14.0000 mg | MEDICATED_PATCH | Freq: Every day | TRANSDERMAL | Status: DC
  Administered 2022-02-02 – 2022-02-05 (×4): 14 mg via TRANSDERMAL
  Filled 2022-02-02 (×4): qty 1

## 2022-02-02 NOTE — Progress Notes (Signed)
Physical Therapy Treatment ?Patient Details ?Name: Krystal Sherman ?MRN: [id number] ?DOB: Oct 17, 1981 ?Today's Date: 02/02/2022 ? ? ?History of Present Illness Pt is a 41 y/o female presenting on 3/8 with L sided weakness. MRI with extensive acute infarction in R temporoparietal cortex and in superior R frontal cortext, roughly watershed pattern, R occupital cortex infract chronic (but new since last CVA).  PMH includes: CVA with residual R sided weakness, HTN, and polysubstance abuse. ? ?  ?PT Comments  ? ? Patient very labile and tearful on arrival. Asking when she will be released to go home. No awareness that her current living situation would be unsafe with her current deficits (up steep hill to her tent). Conitnues to need moderate cues for safety during ambulation (to avoid obstacles) and despite this, twice ran into objects on her left side. She was able to locate the toilet and then the recliner (which were on her left), but unable to problem-solve where her room (06) was when standing and looking at the sign for room 05. Can continue to benefit from skilled therapy interventions.  ?   ?Recommendations for follow up therapy are one component of a multi-disciplinary discharge planning process, led by the attending physician.  Recommendations may be updated based on patient status, additional functional criteria and insurance authorization. ? ?Follow Up Recommendations ? Acute inpatient rehab (3hours/day) ?  ?  ?Assistance Recommended at Discharge Frequent or constant Supervision/Assistance  ?Patient can return home with the following A little help with walking and/or transfers;A little help with bathing/dressing/bathroom;Assistance with cooking/housework;Assist for transportation;Help with stairs or ramp for entrance ?  ?Equipment Recommendations ? None recommended by PT  ?  ?Recommendations for Other Services   ? ? ?  ?Precautions / Restrictions Precautions ?Precautions: Fall ?Precaution Comments: monitor  BP ?Restrictions ?Weight Bearing Restrictions: No  ?  ? ?Mobility ? Bed Mobility ?  ?  ?  ?  ?  ?  ?  ?General bed mobility comments: up in recliner ?  ? ?Transfers ?Overall transfer level: Needs assistance ?Equipment used: None ?Transfers: Sit to/from Stand ?Sit to Stand: Min guard ?  ?  ?  ?  ?  ?General transfer comment: able to locate chair on her left and later toilet on her left ?  ? ?Ambulation/Gait ?Ambulation/Gait assistance: Mod assist ?Gait Distance (Feet): 200 Feet (seated rest; 25 x 2) ?Assistive device: None ?Gait Pattern/deviations: Step-through pattern, Decreased stride length, Drifts right/left ?  ?  ?  ?General Gait Details: generally stable and when did lose her balance was able to self-recover, occasional deviation with scanning.  having trouble being aware of and then seeing obstacles on L given almost complete L hemianopsia.difficulty finding her room (it was on the left side of hall as she approached). Overall moderate cues for safety and path-finding ? ? ?Stairs ?  ?  ?  ?  ?  ? ? ?Wheelchair Mobility ?  ? ?Modified Rankin (Stroke Patients Only) ?Modified Rankin (Stroke Patients Only) ?Pre-Morbid Rankin Score: No significant disability ?Modified Rankin: Moderately severe disability ? ? ?  ?Balance Overall balance assessment: Needs assistance ?Sitting-balance support: No upper extremity supported, Feet supported ?Sitting balance-Leahy Scale: Fair ?Sitting balance - Comments: guard for safety ?  ?Standing balance support: No upper extremity supported, During functional activity ?Standing balance-Leahy Scale: Fair ?Standing balance comment: min assist dynamically ?  ?  ?  ?  ?  ?  ?  ?  ?Standardized Balance Assessment ?Standardized Balance Assessment : Dynamic Gait Index ?  ?  Dynamic Gait Index ?Level Surface: Mild Impairment ?Change in Gait Speed: Mild Impairment ?Gait with Horizontal Head Turns: Mild Impairment ?Gait with Vertical Head Turns: Mild Impairment ?Gait and Pivot Turn: Mild  Impairment ?Step Around Obstacles: Moderate Impairment ?  ? ?  ?Cognition Arousal/Alertness: Awake/alert ?Behavior During Therapy: Flat affect ?Overall Cognitive Status: Impaired/Different from baseline ?Area of Impairment: Attention, Memory, Following commands, Safety/judgement, Awareness, Problem solving ?  ?  ?  ?  ?  ?  ?  ?  ?  ?Current Attention Level: Sustained ?Memory: Decreased short-term memory, Decreased recall of precautions ?Following Commands: Follows one step commands consistently, Follows one step commands with increased time, Follows multi-step commands inconsistently ?Safety/Judgement: Decreased awareness of safety, Decreased awareness of deficits ?Awareness: Emergent ?Problem Solving: Slow processing, Decreased initiation, Difficulty sequencing, Requires verbal cues, Requires tactile cues ?General Comments: repeatedly asking about going home.  poor awareness to deficits, safety.  Despite cues to scan to her left, ran into objects on her left x 2 during ambulation of 200 ft; slow processing ?  ?  ? ?  ?Exercises   ? ?  ?General Comments   ?  ?  ? ?Pertinent Vitals/Pain Pain Assessment ?Pain Assessment: Faces ?Faces Pain Scale: No hurt  ? ? ?Home Living   ?  ?  ?  ?  ?  ?  ?  ?  ?  ?   ?  ?Prior Function    ?  ?  ?   ? ?PT Goals (current goals can now be found in the care plan section) Acute Rehab PT Goals ?Patient Stated Goal: home ?Time For Goal Achievement: 02/12/22 ?Potential to Achieve Goals: Good ?Progress towards PT goals: Progressing toward goals ? ?  ?Frequency ? ? ? Min 4X/week ? ? ? ?  ?PT Plan Current plan remains appropriate  ? ? ?Co-evaluation   ?  ?  ?  ?  ? ?  ?AM-PAC PT "6 Clicks" Mobility   ?Outcome Measure ? Help needed turning from your back to your side while in a flat bed without using bedrails?: A Little ?Help needed moving from lying on your back to sitting on the side of a flat bed without using bedrails?: A Little ?Help needed moving to and from a bed to a chair (including  a wheelchair)?: A Little ?Help needed standing up from a chair using your arms (e.g., wheelchair or bedside chair)?: A Little ?Help needed to walk in hospital room?: A Little ?Help needed climbing 3-5 steps with a railing? : A Lot ?6 Click Score: 17 ? ?  ?End of Session Equipment Utilized During Treatment: Gait belt ?Activity Tolerance: Patient tolerated treatment well ?Patient left: with call bell/phone within reach;in chair;with chair alarm set ?Nurse Communication: Mobility status;Other (comment) (decr vision on left impacting safety with mobility) ?PT Visit Diagnosis: Unsteadiness on feet (R26.81);Difficulty in walking, not elsewhere classified (R26.2);Other symptoms and signs involving the nervous system (R29.898) ?  ? ? ?Time: 6834-1962 ?PT Time Calculation (min) (ACUTE ONLY): 19 min ? ?Charges:  $Gait Training: 8-22 mins          ?          ? ? ?Jerolyn Center, PT ?Acute Rehabilitation Services  ?Pager [phone] ?Office [phone] ? ? ? ?Scherrie November Michael Walrath ?02/02/2022, 2:18 PM ? ?

## 2022-02-02 NOTE — Plan of Care (Signed)

## 2022-02-02 NOTE — Progress Notes (Signed)
Inpatient Rehab Admissions Coordinator:  ?Saw pt at bedside. Pt informed AC that number listed for significant other is not in service. Pt gave permission to contact daughter. When that number called, the person who answered the phone said it was the wrong number. Pt also said she wants to go home.  TOC made aware. Will continue to follow.  ? ?Wolfgang Phoenix, MS, CCC-SLP ?Admissions Coordinator ?[phone] ? ?

## 2022-02-02 NOTE — Progress Notes (Signed)
Occupational Therapy Treatment ?Patient Details ?Name: Krystal Sherman ?MRN: [id number] ?DOB: Aug 21, 1981 ?Today's Date: 02/02/2022 ? ? ?History of present illness Pt is a 41 y/o female presenting on 3/8 with L sided weakness. MRI with extensive acute infarction in R temporoparietal cortex and in superior R frontal cortext, roughly watershed pattern, R occupital cortex infract chronic (but new since last CVA).  PMH includes: CVA with residual R sided weakness, HTN, and polysubstance abuse. ?  ?OT comments ? Pt progressing towards goals, continues to be limited by impaired cognition, L visual deficits, and decreased activity tolerance.  Pt currently requires up to mod assist for toileting, min assist for grooming and setup for eating.  Cueing to locate items on L side of tray during self feeding as well as cueing to utilize BUEs to optimize independence. During mobility cueing to avoid obstacles on L side.  Discussed safety and concerns with L visual deficits/inattention. Pt with poor problem solving and awareness, repeatedly asking if she can go home today.    Will follow acutely, continue to recommend CIR.   ? ?Recommendations for follow up therapy are one component of a multi-disciplinary discharge planning process, led by the attending physician.  Recommendations may be updated based on patient status, additional functional criteria and insurance authorization. ?   ?Follow Up Recommendations ? Acute inpatient rehab (3hours/day)  ?  ?Assistance Recommended at Discharge Frequent or constant Supervision/Assistance  ?Patient can return home with the following ? A little help with walking and/or transfers;A little help with bathing/dressing/bathroom;Assistance with cooking/housework;Direct supervision/assist for medications management;Direct supervision/assist for financial management;Assist for transportation;Help with stairs or ramp for entrance ?  ?Equipment Recommendations ? Other (comment) (TBD)  ?  ?Recommendations  for Other Services Rehab consult ? ?  ?Precautions / Restrictions Precautions ?Precautions: Fall ?Precaution Comments: monitor BP ?Restrictions ?Weight Bearing Restrictions: No  ? ? ?  ? ?Mobility Bed Mobility ?Overal bed mobility: Needs Assistance ?Bed Mobility: Supine to Sit ?  ?  ?Supine to sit: Min guard ?  ?  ?General bed mobility comments: for safety ?  ? ?Transfers ?  ?  ?  ?  ?  ?  ?  ?  ?  ?  ?  ?  ?Balance Overall balance assessment: Needs assistance ?Sitting-balance support: No upper extremity supported, Feet supported ?Sitting balance-Leahy Scale: Fair ?  ?  ?Standing balance support: No upper extremity supported, During functional activity ?Standing balance-Leahy Scale: Fair ?Standing balance comment: min guard to min assist dynamically ?  ?  ?  ?  ?  ?  ?  ?  ?  ?  ?  ?   ? ?ADL either performed or assessed with clinical judgement  ? ?ADL Overall ADL's : Needs assistance/impaired ?Eating/Feeding: Sitting;Cueing for compensatory techinques;Set up ?Eating/Feeding Details (indicate cue type and reason): encouragement to use B hands ?Grooming: Minimal assistance;Standing ?  ?  ?  ?  ?  ?  ?  ?  ?  ?Toilet Transfer: Minimal assistance;Ambulation;BSC/3in1 ?  ?Toileting- Clothing Manipulation and Hygiene: Moderate assistance;Sit to/from stand ?Toileting - Clothing Manipulation Details (indicate cue type and reason): cueing and requires assist for mgmt of clothing, continues to wipe over gown ?  ?  ?Functional mobility during ADLs: Minimal assistance;Cueing for safety;Cueing for sequencing ?  ?  ? ?Extremity/Trunk Assessment   ?  ?  ?  ?  ?  ? ?Vision   ?Additional Comments: difficulty locating items on L side of tray, cueing to avoid obstacles during mobility ?  ?  Perception   ?  ?Praxis   ?  ? ?Cognition Arousal/Alertness: Awake/alert ?Behavior During Therapy: Flat affect ?Overall Cognitive Status: Impaired/Different from baseline ?Area of Impairment: Attention, Memory, Following commands, Safety/judgement,  Awareness, Problem solving ?  ?  ?  ?  ?  ?  ?  ?  ?  ?Current Attention Level: Sustained ?Memory: Decreased short-term memory, Decreased recall of precautions ?Following Commands: Follows one step commands consistently, Follows one step commands with increased time, Follows multi-step commands inconsistently ?Safety/Judgement: Decreased awareness of safety, Decreased awareness of deficits ?Awareness: Emergent ?Problem Solving: Slow processing, Decreased initiation, Difficulty sequencing, Requires verbal cues, Requires tactile cues ?General Comments: repeatedly asking about going home.  poor awareness to deficits, safety.  slow processing and poor hygiene. ?  ?  ?   ?Exercises   ? ?  ?Shoulder Instructions   ? ? ?  ?General Comments    ? ? ?Pertinent Vitals/ Pain       Pain Assessment ?Pain Assessment: Faces ?Faces Pain Scale: No hurt ? ?Home Living   ?  ?  ?  ?  ?  ?  ?  ?  ?  ?  ?  ?  ?  ?  ?  ?  ?  ?  ? ?  ?Prior Functioning/Environment    ?  ?  ?  ?   ? ?Frequency ? Min 2X/week  ? ? ? ? ?  ?Progress Toward Goals ? ?OT Goals(current goals can now be found in the care plan section) ? Progress towards OT goals: Progressing toward goals ? ?Acute Rehab OT Goals ?Patient Stated Goal: home ?OT Goal Formulation: With patient ?Time For Goal Achievement: 02/12/22 ?Potential to Achieve Goals: Good  ?Plan Discharge plan remains appropriate;Frequency remains appropriate   ? ?Co-evaluation ? ? ?   ?  ?  ?  ?  ? ?  ?AM-PAC OT "6 Clicks" Daily Activity     ?Outcome Measure ? ? Help from another person eating meals?: A Little ?Help from another person taking care of personal grooming?: A Little ?Help from another person toileting, which includes using toliet, bedpan, or urinal?: A Lot ?Help from another person bathing (including washing, rinsing, drying)?: A Little ?Help from another person to put on and taking off regular upper body clothing?: A Little ?Help from another person to put on and taking off regular lower body  clothing?: A Little ?6 Click Score: 17 ? ?  ?End of Session Equipment Utilized During Treatment: Gait belt ? ?OT Visit Diagnosis: Other abnormalities of gait and mobility (R26.89);Muscle weakness (generalized) (M62.81);Other symptoms and signs involving cognitive function;Other symptoms and signs involving the nervous system (R29.898);Low vision, both eyes (H54.2) ?  ?Activity Tolerance Patient tolerated treatment well ?  ?Patient Left in chair;with call bell/phone within reach;with chair alarm set ?  ?Nurse Communication Mobility status ?  ? ?   ? ?Time: 9147-8295 ?OT Time Calculation (min): 30 min ? ?Charges: OT General Charges ?$OT Visit: 1 Visit ?OT Treatments ?$Self Care/Home Management : 23-37 mins ? ?Barry Brunner, OT ?Acute Rehabilitation Services ?Pager [phone] ?Office [phone] ? ? ?Chancy Milroy ?02/02/2022, 10:30 AM ?

## 2022-02-02 NOTE — Progress Notes (Signed)
PROGRESS NOTE    [name]  DBZ:[id number] DOB: Sep 02, 1981 DOA: 01/28/2022 PCP: [facility]     Brief Narrative:  Krystal Sherman is a 41 year old with prior history of CVA, methamphetamine use-presented with left sided weakness-was found to have acute CVA.  Significant Hospital events: 3/8>> admit to [facility] for left-sided weakness x2 days-found to have acute CVA.   Significant studies: 3/9>> MRI brain: Acute infarction involving right temporoparietal cortex, superior right frontal cortex and watershed pattern. 3/9>> brain MRA: Degraded by motion-advanced intracranial stenosis with a flow gap in the right supraclinoid ICA and left carotid terminus. 3/9>> neck MRA: Negative Motion degraded assessment 3/9>> CT angio head/neck: Carotid artery/vertebral artery normal in the neck.  Irregular stenosis in the cavernous carotid bilaterally, segmental occlusion of left M1 segment, decreased flow in the left MCA branches.  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 3/9>> TTE: EF 55-60%, small pericardial effusion-no evidence of tamponade. 3/9>> A1c: 4.7 3/9>> LDL: 91 3/9>> vitamin B12: 317 3/9>> CRP: 0.9 3/9>> ESR: 17 3/9>> ANA: Pending 3/9>> hypercoagulable work-up: protein S activity low, factor 5 leiden pending 3/10>> TEE: Positive bubble study consistent with PFO 3/10>> lower extremity Dopplers: No DVT   Significant microbiology data: 3/8>> COVID/influenza PCR: Negative  New events last 24 hours / Subjective: Patient not very interactive, sleeping, doesn't want to wake up, voiced no complaints.   Assessment & Plan:   Principal Problem:   Acute ischemic stroke [facility]) Active Problems:   Acute CVA (cerebrovascular accident) ([place])   Acute CVA: right MCA scattered infarct due to right ICA siphon stenosis likely secondary large vessel source in the setting of uncontrolled hypertension and stimulant abuse.  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 Hypercoagulable work-up is pending.  Continue  aspirin/Plavix for 3 months then transition to aspirin alone.  Continue statin.  CIR recommended and placement is pending.  Follow-up with stroke clinic in 4 weeks.   HTN: Amlodipine   Borderline vitamin B12 deficiency: Begin parenteral supplementation-place on oral supplementation on discharge.   Ongoing methamphetamine use: Cessation counseling   History of cocaine use: Claims she does not do cocaine any longer-last use was several months back.    BMI: Estimated body mass index is 32.31 kg/m as calculated from the following:   Height as of this encounter: _0  (1.676 m).   Weight as of this encounter: 90.8 kg.      DVT prophylaxis:  enoxaparin (LOVENOX) injection 40 mg Start: 01/29/22 1200 SCDs Start: 01/29/22 0123  Code Status: Full code Family Communication: No family at bedside Disposition Plan:  Status is: Inpatient Remains inpatient appropriate because: Pending CIR placement  Antimicrobials:  Anti-infectives (From admission, onward)    None        Objective: Vitals:   02/02/22 0000 02/02/22 0400 02/02/22 0500 02/02/22 0853  BP: (!) 154/139 (!) 154/97    Pulse: 87 82  70  Resp: _1 Temp: 98.2 F (36.8 C) 98.1 F (36.7 C)  98.4 F (36.9 C)  TempSrc: Oral Oral  Oral  SpO2: 93% 90%  93%  Weight:   99.1 kg   Height:        Intake/Output Summary (Last 24 hours) at 02/02/2022 1156 Last data filed at 02/01/2022 1830 Gross per 24 hour  Intake 240 ml  Output 500 ml  Net -260 ml    Filed Weights   01/31/22 0500 02/01/22 0302 02/02/22 0500  Weight: 90.8 kg 99.3 kg 99.1 kg    Examination:  General exam: Appears calm  and comfortable   Data Reviewed: I have personally reviewed following labs and imaging studies  CBC: Recent Labs  Lab 01/28/22 2350 01/29/22 0915  WBC 9.1 7.8  NEUTROABS 5.6  --   HGB 13.2 13.3  HCT 40.6 40.7  MCV 84.6 85.3  PLT 218 737    Basic Metabolic Panel: Recent Labs  Lab 01/28/22 2350 01/29/22 0915  NA 137  135  K 3.4* 3.7  CL 104 103  CO2 23 24  GLUCOSE 112* 149*  BUN 9 9  CREATININE 0.90 0.86  CALCIUM 8.8* 8.7*    GFR: Estimated Creatinine Clearance: 104.3 mL/min (by C-G formula based on SCr of 0.86 mg/dL). Liver Function Tests: Recent Labs  Lab 01/28/22 2350  AST 19  ALT 16  ALKPHOS 100  BILITOT 0.4  PROT 6.5  ALBUMIN 3.3*    No results for input(s): LIPASE, AMYLASE in the last 168 hours. No results for input(s): AMMONIA in the last 168 hours. Coagulation Profile: Recent Labs  Lab 01/28/22 2350  INR 0.9    Cardiac Enzymes: No results for input(s): CKTOTAL, CKMB, CKMBINDEX, TROPONINI in the last 168 hours. BNP (last 3 results) No results for input(s): PROBNP in the last 8760 hours. HbA1C: No results for input(s): HGBA1C in the last 72 hours. CBG: No results for input(s): GLUCAP in the last 168 hours. Lipid Profile: No results for input(s): CHOL, HDL, LDLCALC, TRIG, CHOLHDL, LDLDIRECT in the last 72 hours. Thyroid Function Tests: No results for input(s): TSH, T4TOTAL, FREET4, T3FREE, THYROIDAB in the last 72 hours. Anemia Panel: No results for input(s): VITAMINB12, FOLATE, FERRITIN, TIBC, IRON, RETICCTPCT in the last 72 hours. Sepsis Labs: No results for input(s): PROCALCITON, LATICACIDVEN in the last 168 hours.  Recent Results (from the past 240 hour(s))  Resp Panel by RT-PCR (Flu A&B, Covid) Nasopharyngeal Swab     Status: None   Collection Time: 01/28/22 11:44 PM   Specimen: Nasopharyngeal Swab; Nasopharyngeal(NP) swabs in vial transport medium  Result Value Ref Range Status   SARS Coronavirus 2 by RT PCR NEGATIVE NEGATIVE Final    Comment: (NOTE) SARS-CoV-2 target nucleic acids are NOT DETECTED.  The SARS-CoV-2 RNA is generally detectable in upper respiratory specimens during the acute phase of infection. The lowest concentration of SARS-CoV-2 viral copies this assay can detect is 138 copies/mL. A negative result does not preclude SARS-Cov-2 infection  and should not be used as the sole basis for treatment or other patient management decisions. A negative result may occur with  improper specimen collection/handling, submission of specimen other than nasopharyngeal swab, presence of viral mutation(s) within the areas targeted by this assay, and inadequate number of viral copies(<138 copies/mL). A negative result must be combined with clinical observations, patient history, and epidemiological information. The expected result is Negative.  Fact Sheet for Patients:  EntrepreneurPulse.com.au  Fact Sheet for Healthcare Providers:  IncredibleEmployment.be  This test is no t yet approved or cleared by the [place] FDA and  has been authorized for detection and/or diagnosis of SARS-CoV-2 by FDA under an Emergency Use Authorization (EUA). This EUA will remain  in effect (meaning this test can be used) for the duration of the COVID-19 declaration under Section 564(b)(1) of the Act, 21 U.S.C.section 360bbb-3(b)(1), unless the authorization is terminated  or revoked sooner.       Influenza A by PCR NEGATIVE NEGATIVE Final   Influenza B by PCR NEGATIVE NEGATIVE Final    Comment: (NOTE) The Xpert Xpress SARS-CoV-2/FLU/RSV plus assay is intended as  an aid in the diagnosis of influenza from Nasopharyngeal swab specimens and should not be used as a sole basis for treatment. Nasal washings and aspirates are unacceptable for Xpert Xpress SARS-CoV-2/FLU/RSV testing.  Fact Sheet for Patients: EntrepreneurPulse.com.au  Fact Sheet for Healthcare Providers: IncredibleEmployment.be  This test is not yet approved or cleared by the [place] FDA and has been authorized for detection and/or diagnosis of SARS-CoV-2 by FDA under an Emergency Use Authorization (EUA). This EUA will remain in effect (meaning this test can be used) for the duration of the COVID-19 declaration  under Section 564(b)(1) of the Act, 21 U.S.C. section 360bbb-3(b)(1), unless the authorization is terminated or revoked.  Performed at [facility], [place] [address] [place], [place] [address]        Radiology Studies: No results found.    Scheduled Meds:   stroke: mapping our early stages of recovery book   Does not apply Once   amLODipine  10 mg Oral Daily   aspirin EC  81 mg Oral Daily   atorvastatin  40 mg Oral Daily   clopidogrel  75 mg Oral Daily   cyanocobalamin  1,000 mcg Subcutaneous Daily   enoxaparin (LOVENOX) injection  40 mg Subcutaneous Q24H   Continuous Infusions:   LOS: 4 days     Dessa Phi, DO [facility] 02/02/2022, 11:56 AM   Available via Epic secure chat 7am-7pm After these hours, please refer to coverage provider listed on [email or web address]

## 2022-02-03 DIAGNOSIS — D6851 Activated protein C resistance: Secondary | ICD-10-CM

## 2022-02-03 DIAGNOSIS — I10 Essential (primary) hypertension: Secondary | ICD-10-CM

## 2022-02-03 MED ORDER — CHLORTHALIDONE 25 MG PO TABS
25.0000 mg | ORAL_TABLET | Freq: Every day | ORAL | Status: DC
  Administered 2022-02-03 – 2022-02-04 (×2): 25 mg via ORAL
  Filled 2022-02-03 (×3): qty 1

## 2022-02-03 NOTE — Progress Notes (Signed)
?PROGRESS NOTE ? ? ? ?Krystal Sherman  FVC:[id number] DOB: [birth year] DOA: 01/28/2022 ?PCP: [facility]  ? ?  ?Brief Narrative:  ?Krystal Sherman is a 41 year old with prior history of CVA, methamphetamine use-presented with left sided weakness-was found to have acute CVA. ? ?Significant Hospital events: ?3/8>> admit to [facility] for left-sided weakness x2 days-found to have acute CVA. ?  ?Significant studies: ?3/9>> MRI brain: Acute infarction involving right temporoparietal cortex, superior right frontal cortex and watershed pattern. ?3/9>> brain MRA: Degraded by motion-advanced intracranial stenosis with a flow gap in the right supraclinoid ICA and left carotid terminus. ?3/9>> neck MRA: Negative Motion degraded assessment ?3/9>> CT angio head/neck: Carotid artery/vertebral artery normal in the neck.  Irregular stenosis in the cavernous carotid bilaterally, segmental occlusion of left M1 segment, decreased flow in the left MCA branches.  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 ?3/9>> TTE: EF 55-60%, small pericardial effusion-no evidence of tamponade. ?3/9>> A1c: 4.7 ?3/9>> LDL: 91 ?3/9>> vitamin B12: 317 ?3/9>> CRP: 0.9 ?3/9>> ESR: 17 ?3/9>> ANA: Pending ?3/9>> hypercoagulable work-up: protein S activity low, factor 5 leiden heterozygous ?3/10>> TEE: Positive bubble study consistent with PFO ?3/10>> lower extremity Dopplers: No DVT ?  ?Significant microbiology data: ?3/8>> COVID/influenza PCR: Negative ? ?New events last 24 hours / Subjective: ?No new neurologic symptoms. No pain. Tolerating diet ? ?Assessment & Plan: ?  ?Principal Problem: ?  Acute ischemic stroke ([place]) ?Active Problems: ?  Acute CVA (cerebrovascular accident) ([place]) ? ? ?Acute CVA: right MCA scattered infarct due to right ICA siphon stenosis likely secondary large vessel source in the setting of uncontrolled hypertension and stimulant abuse.  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 Small PFO on TTE, neuro does not think significant. Continue aspirin/Plavix for  3 months then transition to aspirin alone.  Continue statin.  CIR recommended and placement is pending.  Follow-up with stroke clinic in 4 weeks. ? ?Factor 5 leiden heterozygote: new dx ?  ?HTN: Remains elevated. Is several days out from CVA so needs better control. Will continue amlodipine, add chlorthalidone. ?  ?Borderline vitamin B12 deficiency: Begin parenteral supplementation-place on oral supplementation on discharge. ?  ?Ongoing methamphetamine use: Cessation counseling ?  ?History of cocaine use: Claims she does not do cocaine any longer-last use was several months back.Marland Kitchen HIV and rpr negative. Will check hepatitis c ?   ?BMI: ?Estimated body mass index is 32.31 kg/m? as calculated from the following: ?  Height as of this encounter: '5\' 6"'  (1.676 m). ?  Weight as of this encounter: 90.8 kg.  ? ? ? ? ?DVT prophylaxis:  ?enoxaparin (LOVENOX) injection 40 mg Start: 01/29/22 1200 ?SCDs Start: 01/29/22 0123 ? ?Code Status: Full code ?Family Communication: No family at bedside. No answer when either contact called today ?Disposition Plan:  ?Status is: Inpatient ?Remains inpatient appropriate because: Pending CIR placement ? ?Antimicrobials:  ?Anti-infectives (From admission, onward)  ? ? None  ? ?  ? ? ? ?Objective: ?Vitals:  ? 02/03/22 0022 02/03/22 0405 02/03/22 0821 02/03/22 1155  ?BP: (!) 157/108 (!) 175/106 (!) 191/123 (!) 174/131  ?Pulse: 77 79 80 90  ?Resp: '18 20 16 16  ' ?Temp: 98.1 ?F (36.7 ?C) 98.4 ?F (36.9 ?C) 98.4 ?F (36.9 ?C) 97.6 ?F (36.4 ?C)  ?TempSrc: Oral Oral Oral Axillary  ?SpO2: 95% 95% 96% 97%  ?Weight:      ?Height:      ? ? ?Intake/Output Summary (Last 24 hours) at 02/03/2022 1323 ?Last data filed at 02/03/2022 0421 ?Gross per 24 hour  ?Intake --  ?  Output 250 ml  ?Net -250 ml  ? ?Filed Weights  ? 01/31/22 0500 02/01/22 0302 02/02/22 0500  ?Weight: 90.8 kg 99.3 kg 99.1 kg  ? ? ?Examination:  ?General exam: Appears calm and comfortable  ? ?Data Reviewed: I have personally reviewed following labs  and imaging studies ? ?CBC: ?Recent Labs  ?Lab 01/28/22 ?2350 01/29/22 ?0915  ?WBC 9.1 7.8  ?NEUTROABS 5.6  --   ?HGB 13.2 13.3  ?HCT 40.6 40.7  ?MCV 84.6 85.3  ?PLT 218 229  ? ?Basic Metabolic Panel: ?Recent Labs  ?Lab 01/28/22 ?2350 01/29/22 ?0915  ?NA 137 135  ?K 3.4* 3.7  ?CL 104 103  ?CO2 23 24  ?GLUCOSE 112* 149*  ?BUN 9 9  ?CREATININE 0.90 0.86  ?CALCIUM 8.8* 8.7*  ? ?GFR: ?Estimated Creatinine Clearance: 104.3 mL/min (by C-G formula based on SCr of 0.86 mg/dL). ?Liver Function Tests: ?Recent Labs  ?Lab 01/28/22 ?2350  ?AST 19  ?ALT 16  ?ALKPHOS 100  ?BILITOT 0.4  ?PROT 6.5  ?ALBUMIN 3.3*  ? ?No results for input(s): LIPASE, AMYLASE in the last 168 hours. ?No results for input(s): AMMONIA in the last 168 hours. ?Coagulation Profile: ?Recent Labs  ?Lab 01/28/22 ?2350  ?INR 0.9  ? ?Cardiac Enzymes: ?No results for input(s): CKTOTAL, CKMB, CKMBINDEX, TROPONINI in the last 168 hours. ?BNP (last 3 results) ?No results for input(s): PROBNP in the last 8760 hours. ?HbA1C: ?No results for input(s): HGBA1C in the last 72 hours. ?CBG: ?No results for input(s): GLUCAP in the last 168 hours. ?Lipid Profile: ?No results for input(s): CHOL, HDL, LDLCALC, TRIG, CHOLHDL, LDLDIRECT in the last 72 hours. ?Thyroid Function Tests: ?No results for input(s): TSH, T4TOTAL, FREET4, T3FREE, THYROIDAB in the last 72 hours. ?Anemia Panel: ?No results for input(s): VITAMINB12, FOLATE, FERRITIN, TIBC, IRON, RETICCTPCT in the last 72 hours. ?Sepsis Labs: ?No results for input(s): PROCALCITON, LATICACIDVEN in the last 168 hours. ? ?Recent Results (from the past 240 hour(s))  ?Resp Panel by RT-PCR (Flu A&B, Covid) Nasopharyngeal Swab     Status: None  ? Collection Time: 01/28/22 11:44 PM  ? Specimen: Nasopharyngeal Swab; Nasopharyngeal(NP) swabs in vial transport medium  ?Result Value Ref Range Status  ? SARS Coronavirus 2 by RT PCR NEGATIVE NEGATIVE Final  ?  Comment: (NOTE) ?SARS-CoV-2 target nucleic acids are NOT DETECTED. ? ?The  SARS-CoV-2 RNA is generally detectable in upper respiratory ?specimens during the acute phase of infection. The lowest ?concentration of SARS-CoV-2 viral copies this assay can detect is ?138 copies/mL. A negative result does not preclude SARS-Cov-2 ?infection and should not be used as the sole basis for treatment or ?other patient management decisions. A negative result may occur with  ?improper specimen collection/handling, submission of specimen other ?than nasopharyngeal swab, presence of viral mutation(s) within the ?areas targeted by this assay, and inadequate number of viral ?copies(<138 copies/mL). A negative result must be combined with ?clinical observations, patient history, and epidemiological ?information. The expected result is Negative. ? ?Fact Sheet for Patients:  ?EntrepreneurPulse.com.au ? ?Fact Sheet for Healthcare Providers:  ?IncredibleEmployment.be ? ?This test is no t yet approved or cleared by the [place] FDA and  ?has been authorized for detection and/or diagnosis of SARS-CoV-2 by ?FDA under an Emergency Use Authorization (EUA). This EUA will remain  ?in effect (meaning this test can be used) for the duration of the ?COVID-19 declaration under Section 564(b)(1) of the Act, 21 ?U.S.C.section 360bbb-3(b)(1), unless the authorization is terminated  ?or revoked sooner.  ? ? ?  ?  Influenza A by PCR NEGATIVE NEGATIVE Final  ? Influenza B by PCR NEGATIVE NEGATIVE Final  ?  Comment: (NOTE) ?The Xpert Xpress SARS-CoV-2/FLU/RSV plus assay is intended as an aid ?in the diagnosis of influenza from Nasopharyngeal swab specimens and ?should not be used as a sole basis for treatment. Nasal washings and ?aspirates are unacceptable for Xpert Xpress SARS-CoV-2/FLU/RSV ?testing. ? ?Fact Sheet for Patients: ?EntrepreneurPulse.com.au ? ?Fact Sheet for Healthcare Providers: ?IncredibleEmployment.be ? ?This test is not yet approved or  cleared by the [place] FDA and ?has been authorized for detection and/or diagnosis of SARS-CoV-2 by ?FDA under an Emergency Use Authorization (EUA). This EUA will remain ?in effect (meaning this test can be

## 2022-02-03 NOTE — Progress Notes (Signed)
Physical Therapy Treatment ?Patient Details ?Name: Krystal Sherman ?MRN: [id number] ?DOB: December 10, 1980 ?Today's Date: 02/03/2022 ? ? ?History of Present Illness Pt is a 41 y/o female presenting on 3/8 with L sided weakness. MRI with extensive acute infarction in R temporoparietal cortex and in superior R frontal cortext, roughly watershed pattern, R occupital cortex infract chronic (but new since last CVA).  PMH includes: CVA with residual R sided weakness, HTN, and polysubstance abuse. ? ?  ?PT Comments  ? ? Patient improving with scanning and avoiding objects in her room, however continues to require cues and only avoids ~70% of objects when in busy hallway. Easily distracted with cognitive tasks that causes her to lose focus on attending to left side and increases risk of injury by running into objects on her left. Continue to feel she could benefit from intensity of AIR.  ?   ?Recommendations for follow up therapy are one component of a multi-disciplinary discharge planning process, led by the attending physician.  Recommendations may be updated based on patient status, additional functional criteria and insurance authorization. ? ?Follow Up Recommendations ? Acute inpatient rehab (3hours/day) ?  ?  ?Assistance Recommended at Discharge Frequent or constant Supervision/Assistance  ?Patient can return home with the following A little help with walking and/or transfers;A little help with bathing/dressing/bathroom;Assistance with cooking/housework;Assist for transportation;Help with stairs or ramp for entrance ?  ?Equipment Recommendations ? None recommended by PT  ?  ?Recommendations for Other Services   ? ? ?  ?Precautions / Restrictions Precautions ?Precautions: Fall ?Precaution Comments: monitor BP ?Restrictions ?Weight Bearing Restrictions: No  ?  ? ?Mobility ? Bed Mobility ?  ?  ?  ?  ?  ?  ?  ?General bed mobility comments: up in recliner ?  ? ?Transfers ?Overall transfer level: Needs assistance ?Equipment used:  None ?Transfers: Sit to/from Stand ?Sit to Stand: Supervision ?  ?  ?  ?  ?  ?General transfer comment: locates chair and toilet on her left during transfers; aligns herself appropriately ?  ? ?Ambulation/Gait ?Ambulation/Gait assistance: Min guard ?Gait Distance (Feet): 300 Feet ?Assistive device: None ?Gait Pattern/deviations: Step-through pattern, Decreased stride length ?  ?Gait velocity interpretation: 1.31 - 2.62 ft/sec, indicative of limited community ambulator ?  ?General Gait Details: generally stable; most of the time walking with her head turned to the left and required cues to also check to her right. Avoided ~70% of objects on her left and only ran into computer stand causing her to slightly lose her balance and independently recovered. Passed her room on her left and when got to end of hall was unsure which way she should go. Saw room 01 and end of the hall and still took >30 seconds to decide she needed to turn around. Able to find her room (now on her right as she approached it). ? ? ?Stairs ?  ?  ?  ?  ?  ? ? ?Wheelchair Mobility ?  ? ?Modified Rankin (Stroke Patients Only) ?Modified Rankin (Stroke Patients Only) ?Pre-Morbid Rankin Score: No significant disability ?Modified Rankin: Moderately severe disability ? ? ?  ?Balance Overall balance assessment: Needs assistance ?Sitting-balance support: No upper extremity supported, Feet supported ?Sitting balance-Leahy Scale: Fair ?Sitting balance - Comments: guard for safety ?  ?Standing balance support: No upper extremity supported, During functional activity ?Standing balance-Leahy Scale: Fair ?Standing balance comment: min assist dynamically ?  ?  ?  ?  ?  ?  ?  ?  ?  ?  ?  ?  ? ?  ?  Cognition Arousal/Alertness: Awake/alert ?Behavior During Therapy: [facility] for tasks assessed/performed ?Overall Cognitive Status: No family/caregiver present to determine baseline cognitive functioning ?Area of Impairment: Attention, Awareness, Problem solving, Memory,  Safety/judgement ?  ?  ?  ?  ?  ?  ?  ?  ?  ?Current Attention Level: Sustained ?Memory: Decreased short-term memory, Decreased recall of precautions ?Following Commands: Follows one step commands consistently, Follows multi-step commands inconsistently ?Safety/Judgement: Decreased awareness of safety, Decreased awareness of deficits ?Awareness: Emergent ?Problem Solving: Slow processing, Requires verbal cues ?General Comments: reports her vision is better today and attending to left side of environment better, but not consistently when walking (ran into one computer stand and nearly 2 others) ?  ?  ? ?  ?Exercises   ? ?  ?General Comments   ?  ?  ? ?Pertinent Vitals/Pain Pain Assessment ?Pain Assessment: No/denies pain ?Faces Pain Scale: No hurt  ? ? ?Home Living Family/patient expects to be discharged to:: Private residence ?Living Arrangements: Spouse/significant other ?Available Help at Discharge: Family;Available 24 hours/day ?Type of Home: Homeless ?Home Access: Other (comment) (ste[place] to get to tent) ?  ?  ?  ?  ?Home Equipment: None ?Additional Comments: lives in tent with fiance.  ?  ?Prior Function    ?  ?  ?   ? ?PT Goals (current goals can now be found in the care plan section) Acute Rehab PT Goals ?Patient Stated Goal: home ?Time For Goal Achievement: 02/12/22 ?Potential to Achieve Goals: Good ?Progress towards PT goals: Progressing toward goals ? ?  ?Frequency ? ? ? Min 4X/week ? ? ? ?  ?PT Plan Current plan remains appropriate  ? ? ?Co-evaluation   ?  ?  ?  ?  ? ?  ?AM-PAC PT "6 Clicks" Mobility   ?Outcome Measure ? Help needed turning from your back to your side while in a flat bed without using bedrails?: A Little ?Help needed moving from lying on your back to sitting on the side of a flat bed without using bedrails?: A Little ?Help needed moving to and from a bed to a chair (including a wheelchair)?: A Little ?Help needed standing up from a chair using your arms (e.g., wheelchair or bedside  chair)?: A Little ?Help needed to walk in hospital room?: A Little ?Help needed climbing 3-5 steps with a railing? : A Lot ?6 Click Score: 17 ? ?  ?End of Session   ?Activity Tolerance: Patient tolerated treatment well ?Patient left: in chair;with nursing/sitter in room (in chair at sink about to do her bath with tech present) ?Nurse Communication: Mobility status;Other (comment) (decr vision on left impacting safety with mobility) ?PT Visit Diagnosis: Unsteadiness on feet (R26.81);Difficulty in walking, not elsewhere classified (R26.2);Other symptoms and signs involving the nervous system (R29.898) ?  ? ? ?Time: 1001-1015 ?PT Time Calculation (min) (ACUTE ONLY): 14 min ? ?Charges:  $Gait Training: 8-22 mins          ?          ? ? ?Jerolyn Center, PT ?Acute Rehabilitation Services  ?Pager [phone] ?Office [phone] ? ? ? ?Scherrie November Tarvaris Puglia ?02/03/2022, 10:31 AM ? ?

## 2022-02-03 NOTE — Plan of Care (Signed)

## 2022-02-03 NOTE — Progress Notes (Signed)
Inpatient Rehab Admissions Coordinator:  ? ?I reviewed case with rehab MD and spoke to her husband Jess Barters. He is able to provide 24/7 support, but Pt will likely d/c back to the tent where she lives. I will open a case with her insurance and pursue for insurance auth.  ? ?Megan Salon, MS, CCC-SLP ?Rehab Admissions Coordinator  ?[phone] (celll) ?[phone] (office) ? ?

## 2022-02-03 NOTE — TOC Progression Note (Signed)
Transition of Care (TOC) - Progression Note  ? ? ?Patient Details  ?Name: Krystal Sherman ?MRN: [id number] ?Date of Birth: 1981-10-16 ? ?Transition of Care (TOC) CM/SW Contact  ?Harriet Masson, RN ?Phone Number: ?02/03/2022, 8:25 AM ? ?Clinical Narrative:    ?11 Spoke to patient regarding transition plans.  ?Patient expresses the desire to go home. Patient is agreeable for RNCM to speak to husband and daughter. Patient didn't know husbands number and daughter wasn't answering phone call. RNCM left number with patient to have husband call. ?1600 Jess Barters called RNCM and he stated that [place]. They haven't lived with mother in law in 3 months. Jess Barters states he doesn't have a car and wants patient to go to CIR. Jess Barters stated he will call patient and encourage her to go to rehab. RNCM updated Xavier's number in the chart ?1615 Jess Barters called back and stated patient is agreeable to go to rehab ?1620 RNCM spoke to patient and she agrees to go to CIR. RNCM secure chatted Lauren with CIR to notify her. ? ? ? ?Expected Discharge Plan: IP Rehab Facility ?Barriers to Discharge: Continued Medical Work up ? ?Expected Discharge Plan and Services ?Expected Discharge Plan: IP Rehab Facility ?  ?  ?  ?Living arrangements for the past 2 months: Homeless ?                ?  ?  ?  ?  ?  ?  ?  ?  ?  ?  ? ? ?Social Determinants of Health (SDOH) Interventions ?  ? ?Readmission Risk Interventions ?No flowsheet data found. ? ?

## 2022-02-03 NOTE — Progress Notes (Signed)
Speech Language Pathology Treatment: Cognitive-Linquistic  ?Patient Details ?Name: Krystal Sherman ?MRN: [id number] ?DOB: May 31, 1981 ?Today's Date: 02/03/2022 ?Time: 0347-4259 ?SLP Time Calculation (min) (ACUTE ONLY): 11 min ? ?Assessment / Plan / Recommendation ?Clinical Impression ? Pt was emotionally labile and tearful throughout session, ruminating on being in the hospital, needing to call for help, and having to negotiate leads/wires. She is very eager to go home. Tx focused on awareness, safety/judgment, and recall. When asked questions, she initially becomes more upset, but then is able to identify several areas of acute change that she would like to work on, including her memory, her attention to her L side, and her ability to use her L hand. She needs cues more so for anticipatory awareness when asked to elaborate on why she needs to work on these areas in therapy. Pt can remember that she had a therapy session today and two yesterday, but needs cues to recall more specific details about what she did in these sessions. She did find her call bell and her phone on her L side with Mod I and extra time. Continue to recommend AIR level therapy upon discharge.  ?  ?HPI HPI: Pt is a 41 y/o female presenting on 3/8 with L sided weakness. MRI (3/9) with extensive acute infarction in R temporoparietal cortex and in superior R frontal cortext, roughly watershed pattern, R occupital cortex infract chronic (but new since last CVA).  PMH includes: CVA with residual R sided weakness, HTN, and polysubstance abuse. ?  ?   ?SLP Plan ? Continue with current plan of care ? ?  ?  ?Recommendations for follow up therapy are one component of a multi-disciplinary discharge planning process, led by the attending physician.  Recommendations may be updated based on patient status, additional functional criteria and insurance authorization. ?  ? ?Recommendations  ?   ?   ?    ?   ? ? ? ? Follow Up Recommendations: Acute inpatient rehab  (3hours/day) ?Assistance recommended at discharge: Frequent or constant Supervision/Assistance ?SLP Visit Diagnosis: Cognitive communication deficit (R41.841);Dysarthria and anarthria (R47.1) ?Plan: Continue with current plan of care ? ? ? ? ?  ?  ? ? ?Mahala Menghini., M.A. CCC-SLP ?Acute Rehabilitation Services ?Pager [phone] ?Office [phone] ? ? ?02/03/2022, 3:28 PM ?

## 2022-02-03 NOTE — Care Management Important Message (Signed)
Important Message ? ?Patient Details  ?Name: Krystal Sherman ?MRN: [id number] ?Date of Birth: 02-12-1981 ? ? ?Medicare Important Message Given:  Yes ? ? ? ? ?Owen Pratte ?02/03/2022, 2:34 PM ?

## 2022-02-04 ENCOUNTER — Other Ambulatory Visit (HOSPITAL_COMMUNITY): Payer: Self-pay

## 2022-02-04 LAB — BASIC METABOLIC PANEL
Anion gap: 13 (ref 5–15)
BUN: 13 mg/dL (ref 6–20)
CO2: 23 mmol/L (ref 22–32)
Calcium: 9.6 mg/dL (ref 8.9–10.3)
Chloride: 100 mmol/L (ref 98–111)
Creatinine, Ser: 0.81 mg/dL (ref 0.44–1.00)
GFR, Estimated: >60 mL/min (ref >60)
Glucose, Bld: 87 mg/dL (ref 70–99)
Potassium: 3.2 mmol/L — ABNORMAL LOW (ref 3.5–5.1)
Sodium: 136 mmol/L (ref 135–145)

## 2022-02-04 LAB — ANTINUCLEAR ANTIBODIES, IFA: ANA Ab, IFA: NEGATIVE

## 2022-02-04 MED ORDER — CLOPIDOGREL BISULFATE 75 MG PO TABS
75.0000 mg | ORAL_TABLET | Freq: Every day | ORAL | 0 refills | Status: AC
  Filled 2022-02-04: qty 90, 90d supply, fill #0

## 2022-02-04 MED ORDER — ASPIRIN 81 MG PO TBEC
81.0000 mg | DELAYED_RELEASE_TABLET | Freq: Every day | ORAL | 11 refills | Status: AC
  Filled 2022-02-04: qty 30, 30d supply, fill #0

## 2022-02-04 MED ORDER — ATORVASTATIN CALCIUM 40 MG PO TABS
40.0000 mg | ORAL_TABLET | Freq: Every day | ORAL | 11 refills | Status: AC
  Filled 2022-02-04: qty 30, 30d supply, fill #0

## 2022-02-04 MED ORDER — CHLORTHALIDONE 25 MG PO TABS
25.0000 mg | ORAL_TABLET | Freq: Every day | ORAL | 11 refills | Status: AC
  Filled 2022-02-04: qty 30, 30d supply, fill #0

## 2022-02-04 MED ORDER — AMLODIPINE BESYLATE 10 MG PO TABS
10.0000 mg | ORAL_TABLET | Freq: Every day | ORAL | 11 refills | Status: AC
Start: 2022-02-05 — End: ?
  Filled 2022-02-04: qty 30, 30d supply, fill #0

## 2022-02-04 MED ORDER — VITAMIN B-12 1000 MCG PO TABS
1000.0000 ug | ORAL_TABLET | Freq: Every day | ORAL | 3 refills | Status: AC
  Filled 2022-02-04: qty 30, 30d supply, fill #0

## 2022-02-04 NOTE — Progress Notes (Signed)
?      ?                 PROGRESS NOTE ? ?      ?PATIENT DETAILS ?Name: Krystal Sherman ?Age: 41 y.o. ?Sex: female ?Date of Birth: 09/26/1981 ?Admit Date: 01/28/2022 ?Admitting Physician Jonetta Osgood, MD ?[id number], [facility] ? ?Brief Summary: ?41 year old with prior history of CVA, methamphetamine use-presented with left sided weakness-was found to have acute CVA.  See below for further details. ? ?Significant Hospital events: ?3/8>> admit to [facility] for left-sided weakness x2 days-found to have acute CVA. ? ?Significant studies: ?3/9>> MRI brain: Acute infarction involving right temporoparietal cortex, superior right frontal cortex and watershed pattern. ?3/9>> brain MRA: Degraded by motion-advanced intracranial stenosis with a flow gap in the right supraclinoid ICA and left carotid terminus. ?3/9>> neck MRA: Negative Motion degraded assessment ?3/9>> CT angio head/neck: Carotid artery/vertebral artery normal in the neck.  Irregular stenosis in the cavernous carotid bilaterally, segmental occlusion of left M1 segment, decreased flow in the left MCA branches.  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 ?3/9>> TTE: EF 55-60%, small pericardial effusion-no evidence of tamponade. ?3/9>> A1c: 4.7 ?3/9>> LDL: 91 ?3/9>> vitamin B12: 317 ?3/9>> CRP: 0.9 ?3/9>> ESR: 17 ?3/9>> ANA: Pending ?3/9>> hypercoagulable work-up: protein S activity low, factor 5 leiden heterozygous ?3/10>> TEE: Positive bubble study consistent with PFO ?3/10>> lower extremity Dopplers: No DVT ? ?Significant microbiology data: ?3/8>> COVID/influenza PCR: Negative ? ?Procedures: ?None ? ?Consults:  ?Neurology ? ?Subjective: ?No major issues overnight. ? ?Objective: ?Vitals: ?Blood pressure (!) 152/94, pulse 74, temperature 98.2 ?F (36.8 ?C), temperature source Oral, resp. rate 20, height _0  (1.676 m), weight 99.1 kg, SpO2 93 %.  ? ?Exam: ?Gen Exam:Alert awake-not in any distress ?HEENT:atraumatic, normocephalic ?Chest: B/L clear to auscultation  anteriorly ?CVS:S1S2 regular ?Abdomen:soft non tender, non distended ?Extremities:no edema ?Neurology: Non focal ?Skin: no rash  ? ?Pertinent Labs/Radiology: ?CBC Latest Ref Rng & Units 01/29/2022 01/28/2022 09/10/2019  ?WBC 4.0 - 10.5 K/uL 7.8 9.1 6.8  ?Hemoglobin 12.0 - 15.0 g/dL 13.3 13.2 11.8(L)  ?Hematocrit 36.0 - 46.0 % 40.7 40.6 37.4  ?Platelets 150 - 400 K/uL 229 218 269  ?  ?Lab Results  ?Component Value Date  ? NA 136 02/04/2022  ? K 3.2 (L) 02/04/2022  ? CL 100 02/04/2022  ? CO2 23 02/04/2022  ?  ? ? 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  Discussed hypercoagulable work-up findings with stroke MD Dr. Erlinda Hong on 3/15-recommendations are to repeat in 3 months.  Stroke MD recommending aspirin/Plavix for 3 months followed by aspirin alone.  Remains on statin.  This MD did appear to peer with insurance MD for CIR-however was denied.  Transition of care working on finding appropriate disposition-we will await recommendations. ? ?HTN: Initially permissive hypertension was allowed-stable on amlodipine and chlorthalidone.   ? ?Borderline vitamin B12 deficiency: Begin parenteral supplementation-place on oral supplementation on discharge. ? 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 ? ?History of cocaine use: Claims she does not do cocaine any longer-last use was several months back. ? ? ?BMI: ?Estimated body mass index is 35.26 kg/m? as calculated from the following: ?  Height as of this encounter: _1  (1.676 m). ?  Weight as of this encounter: 99.1 kg.  ? ?Code status: ?  Code Status: Full Code  ? ?DVT Prophylaxis: ?enoxaparin (LOVENOX) injection 40 mg Start: 01/29/22 1200 ?SCDs Start: 01/29/22 0123 ?  ?  Family Communication: None at bedside ? ? ?Disposition Plan: ?Status is: Observation ?The patient will require care spanning > 2 midnights and should be moved to inpatient  because: Await CIR placement. ?  ?Planned Discharge Destination: Probably home with outpatient PT. ? ? ?Diet: ?Diet Order   ? ?       ?  Diet Heart Room service appropriate? Yes; Fluid consistency: Thin  Diet effective now       ?  ? ?  ?  ? ?  ?  ? ? ?Antimicrobial agents: ?Anti-infectives (From admission, onward)  ? ? None  ? ?  ? ? ? ?MEDICATIONS: ?Scheduled Meds: ?  stroke: mapping our early stages of recovery book   Does not apply Once  ? amLODipine  10 mg Oral Daily  ? aspirin EC  81 mg Oral Daily  ? atorvastatin  40 mg Oral Daily  ? chlorthalidone  25 mg Oral Daily  ? clopidogrel  75 mg Oral Daily  ? cyanocobalamin  1,000 mcg Subcutaneous Daily  ? enoxaparin (LOVENOX) injection  40 mg Subcutaneous Q24H  ? nicotine  14 mg Transdermal Daily  ? ?Continuous Infusions: ?PRN Meds:.acetaminophen **OR** acetaminophen, clonazePAM, hydrALAZINE, labetalol ? ? ?I have personally reviewed following labs and imaging studies ? ?LABORATORY DATA: ?CBC: ?Recent Labs  ?Lab 01/28/22 ?2350 01/29/22 ?0915  ?WBC 9.1 7.8  ?NEUTROABS 5.6  --   ?HGB 13.2 13.3  ?HCT 40.6 40.7  ?MCV 84.6 85.3  ?PLT 218 229  ? ? ? ?Basic Metabolic Panel: ?Recent Labs  ?Lab 01/28/22 ?2350 01/29/22 ?0915 02/04/22 ?0352  ?NA 137 135 136  ?K 3.4* 3.7 3.2*  ?CL 104 103 100  ?CO2 _0 ?GLUCOSE 112* 149* 87  ?BUN _1 ?CREATININE 0.90 0.86 0.81  ?CALCIUM 8.8* 8.7* 9.6  ? ? ? ?GFR: ?Estimated Creatinine Clearance: 110.7 mL/min (by C-G formula based on SCr of 0.81 mg/dL). ? ?Liver Function Tests: ?Recent Labs  ?Lab 01/28/22 ?2350  ?AST 19  ?ALT 16  ?ALKPHOS 100  ?BILITOT 0.4  ?PROT 6.5  ?ALBUMIN 3.3*  ? ? ?No results for input(s): LIPASE, AMYLASE in the last 168 hours. ?No results for input(s): AMMONIA in the last 168 hours. ? ?Coagulation Profile: ?Recent Labs  ?Lab 01/28/22 ?2350  ?INR 0.9  ? ? ? ?Cardiac Enzymes: ?No results for input(s): CKTOTAL, CKMB, CKMBINDEX, TROPONINI in the last 168 hours. ? ?BNP (last 3 results) ?No results for input(s): PROBNP  in the last 8760 hours. ? ?Lipid Profile: ?No results for input(s): CHOL, HDL, LDLCALC, TRIG, CHOLHDL, LDLDIRECT in the last 72 hours. ? ? ?Thyroid Function Tests: ?No results for input(s): TSH, T4TOTAL, FREET4, T3FREE, THYROIDAB in the last 72 hours. ? ? ?Anemia Panel: ?No results for input(s): VITAMINB12, FOLATE, FERRITIN, TIBC, IRON, RETICCTPCT in the last 72 hours. ? ? ?Urine analysis: ?   ?Component Value Date/Time  ? [place] YELLOW 01/29/2022 0205  ? APPEARANCEUR HAZY (A) 01/29/2022 0205  ? LABSPEC 1.019 01/29/2022 0205  ? PHURINE 6.0 01/29/2022 0205  ? [place] NEGATIVE 01/29/2022 0205  ? [place] NEGATIVE 01/29/2022 0205  ? [place] NEGATIVE 01/29/2022 0205  ? [place] NEGATIVE 01/29/2022 0205  ? [place] NEGATIVE 01/29/2022 0205  ? NITRITE NEGATIVE 01/29/2022 0205  ? LEUKOCYTESUR TRACE (A) 01/29/2022 0205  ? ? ?Sepsis Labs: ?Lactic Acid, Venous ?No results found for: LATICACIDVEN ? ?MICROBIOLOGY: ?Recent Results (from the past 240 hour(s))  ?Resp Panel by RT-PCR (Flu A&B, Covid) Nasopharyngeal Swab  Status: None  ? Collection Time: 01/28/22 11:44 PM  ? Specimen: Nasopharyngeal Swab; Nasopharyngeal(NP) swabs in vial transport medium  ?Result Value Ref Range Status  ? SARS Coronavirus 2 by RT PCR NEGATIVE NEGATIVE Final  ?  Comment: (NOTE) ?SARS-CoV-2 target nucleic acids are NOT DETECTED. ? ?The SARS-CoV-2 RNA is generally detectable in upper respiratory ?specimens during the acute phase of infection. The lowest ?concentration of SARS-CoV-2 viral copies this assay can detect is ?138 copies/mL. A negative result does not preclude SARS-Cov-2 ?infection and should not be used as the sole basis for treatment or ?other patient management decisions. A negative result may occur with  ?improper specimen collection/handling, submission of specimen other ?than nasopharyngeal swab, presence of viral mutation(s) within the ?areas targeted by this assay, and inadequate number of viral ?copies(<138 copies/mL). A  negative result must be combined with ?clinical observations, patient history, and epidemiological ?information. The expected result is Negative. ? ?Fact Sheet for Patients:  ?http://guzman.com/

## 2022-02-04 NOTE — TOC Transition Note (Signed)
Transition of Care (TOC) - CM/SW Discharge Note ? ? ?Patient Details  ?Name: Krystal Sherman ?MRN: [id number] ?Date of Birth: 18-Oct-1981 ? ?Transition of Care (TOC) CM/SW Contact:  ?Nemesio Castrillon D Hoa Briggs, Student-Social Work ?Phone Number: ?02/04/2022, 2:35 PM ? ? ?Clinical Narrative:    ?CSW intern visited patient at bedside to provide housing and substance use resources. Upon walking in, the patient was immediately tearful stating that they want to go home but can't, because her mother in laws car won't make it from [place] to [place]. The patient then stated they will will be able to leave in a few hours once their husbands sister gets off work. The patient was very accepting of resources, stating that she is already on the wait list for section 8 housing. The patient stated she is open to receiving substance use treatment, and has not used in almost a month. CSW intern provided supportive counseling and resources. ?Final next level of care: OP Rehab ?Barriers to Discharge: Barriers Resolved ? ? ?Patient Goals and CMS Choice ?Patient states their goals for this hospitalization and ongoing recovery are:: Go home ?  ?  ? ?Discharge Placement ?  ?           ?  ?  ?  ?  ? ?Discharge Plan and Services ?  ?  ?           ?  ?  ?  ?  ?  ?  ?  ?  ?  ?  ? ?Social Determinants of Health (SDOH) Interventions ?  ? ? ?Readmission Risk Interventions ?Readmission Risk Prevention Plan 02/04/2022  ?Post Dischage Appt Complete  ?Medication Screening Complete  ?Transportation Screening Complete  ?Some recent data might be hidden  ? ? ? ? ? ?

## 2022-02-04 NOTE — Progress Notes (Signed)
Occupational Therapy Treatment ?Patient Details ?Name: Krystal Sherman ?MRN: [id number] ?DOB: 1980-12-16 ?Today's Date: 02/04/2022 ? ? ?History of present illness Pt is a 41 y/o female presenting on 3/8 with L sided weakness. MRI with extensive acute infarction in R temporoparietal cortex and in superior R frontal cortext, roughly watershed pattern, R occupital cortex infract chronic (but new since last CVA).  PMH includes: CVA with residual R sided weakness, HTN, and polysubstance abuse. ?  ?OT comments ? Pt supine in bed and agreeable to OT.  She is eager to dc home. Reviewed safety and precautions due to L sided deficits, visual deficits and inattention. Awareness improving, but continues to be limited by recall, problem solving.  She is able to complete ADL tasks with min to supervision assist, mobility navigating in room with min guard to supervision for L awareness.  She requires mod cueing to recall 4 items during room search, but able to recall 2 tasks to complete with supervision. Provided handouts for L sided coordination and visual deficits. Updated dc plan to outpt OT-  pts insurance denied CIR and pt reports she will have 24/7 support from her husband.  He can assist with all IADLs, she reports his mom can provide transportation to appointments.  Will follow.   ? ?Recommendations for follow up therapy are one component of a multi-disciplinary discharge planning process, led by the attending physician.  Recommendations may be updated based on patient status, additional functional criteria and insurance authorization. ?   ?Follow Up Recommendations ? Outpatient OT (insurance denied CIR)  ?  ?Assistance Recommended at Discharge Frequent or constant Supervision/Assistance  ?Patient can return home with the following ? A little help with walking and/or transfers;A little help with bathing/dressing/bathroom;Assistance with cooking/housework;Direct supervision/assist for medications management;Direct  supervision/assist for financial management;Assist for transportation;Help with stairs or ramp for entrance ?  ?Equipment Recommendations ? Other (comment) (TBD)  ?  ?Recommendations for Other Services Rehab consult ? ?  ?Precautions / Restrictions Precautions ?Precautions: Fall ?Precaution Comments: monitor BP ?Restrictions ?Weight Bearing Restrictions: No  ? ? ?  ? ?Mobility Bed Mobility ?Overal bed mobility: Needs Assistance ?Bed Mobility: Supine to Sit, Sit to Supine ?  ?  ?Supine to sit: Supervision ?Sit to supine: Supervision ?  ?  ?  ? ?Transfers ?  ?  ?  ?  ?  ?  ?  ?  ?  ?  ?  ?  ?Balance Overall balance assessment: Needs assistance ?Sitting-balance support: No upper extremity supported, Feet supported ?Sitting balance-Leahy Scale: Fair ?Sitting balance - Comments: guard for safety ?  ?Standing balance support: No upper extremity supported, During functional activity ?Standing balance-Leahy Scale: Fair ?Standing balance comment: dynamically ?  ?  ?  ?  ?  ?  ?  ?  ?  ?  ?  ?   ? ?ADL either performed or assessed with clinical judgement  ? ?ADL Overall ADL's : Needs assistance/impaired ?  ?  ?Grooming: Supervision/safety;Standing ?Grooming Details (indicate cue type and reason): min cueing for problem solving and L sided awareness ?  ?  ?Lower Body Bathing: Sit to/from stand;Supervison/ safety ?Lower Body Bathing Details (indicate cue type and reason): simulated seated at sink ?  ?  ?Lower Body Dressing: Supervision/safety;Sit to/from stand ?Lower Body Dressing Details (indicate cue type and reason): donning/doffing socks supervision standing ?Toilet Transfer: Ambulation;Min guard ?  ?  ?  ?  ?  ?Functional mobility during ADLs: Min guard;Cueing for safety ?General ADL Comments: cueing for  L sided awareness, problem solving and visual scanning ?  ? ?Extremity/Trunk Assessment   ?  ?  ?  ?  ?  ? ?Vision   ?  ?  ?Perception   ?  ?Praxis   ?  ? ?Cognition Arousal/Alertness: Awake/alert ?Behavior During Therapy:  [facility] for tasks assessed/performed ?Overall Cognitive Status: No family/caregiver present to determine baseline cognitive functioning ?Area of Impairment: Attention, Memory, Following commands, Safety/judgement, Awareness, Problem solving ?  ?  ?  ?  ?  ?  ?  ?  ?  ?Current Attention Level: Sustained ?Memory: Decreased short-term memory, Decreased recall of precautions ?Following Commands: Follows one step commands consistently, Follows one step commands with increased time, Follows multi-step commands inconsistently ?Safety/Judgement: Decreased awareness of safety, Decreased awareness of deficits ?Awareness: Emergent ?Problem Solving: Slow processing, Requires verbal cues ?General Comments: reports vision improving, continues to require cueing to attend to and scan to [place].  decreased recall and poor problem solving. Completing scavenger hunt in room to find ADL items, able to recall 4/4 initally but with 5 minute delay only able to recall 1/4.  Reviewed items and pt able to recall/locate in room with up to min assist.  She recall 2 tasks to complete after with min assist. ?  ?  ?   ?Exercises   ? ?  ?Shoulder Instructions   ? ? ?  ?General Comments reviewed exercises to L hand and provided handout.  Requires anchor to read and scan completely to L side of exercises. Provided handout for visual deficits and safety.  ? ? ?Pertinent Vitals/ Pain       Pain Assessment ?Pain Assessment: Faces ?Faces Pain Scale: No hurt ? ?Home Living   ?  ?  ?  ?  ?  ?  ?  ?  ?  ?  ?  ?  ?  ?  ?  ?  ?  ?  ? ?  ?Prior Functioning/Environment    ?  ?  ?  ?   ? ?Frequency ? Min 2X/week  ? ? ? ? ?  ?Progress Toward Goals ? ?OT Goals(current goals can now be found in the care plan section) ? Progress towards OT goals: Progressing toward goals ? ?Acute Rehab OT Goals ?Patient Stated Goal: home ?OT Goal Formulation: With patient ?Time For Goal Achievement: 02/12/22 ?Potential to Achieve Goals: Good  ?Plan Frequency remains  appropriate;Discharge plan needs to be updated   ? ?Co-evaluation ? ? ?   ?  ?  ?  ?  ? ?  ?AM-PAC OT "6 Clicks" Daily Activity     ?Outcome Measure ? ? Help from another person eating meals?: A Little ?Help from another person taking care of personal grooming?: A Little ?Help from another person toileting, which includes using toliet, bedpan, or urinal?: A Little ?Help from another person bathing (including washing, rinsing, drying)?: A Little ?Help from another person to put on and taking off regular upper body clothing?: A Little ?Help from another person to put on and taking off regular lower body clothing?: A Little ?6 Click Score: 18 ? ?  ?End of Session Equipment Utilized During Treatment: Gait belt ? ?OT Visit Diagnosis: Other abnormalities of gait and mobility (R26.89);Muscle weakness (generalized) (M62.81);Other symptoms and signs involving cognitive function;Other symptoms and signs involving the nervous system (R29.898);Low vision, both eyes (H54.2) ?  ?Activity Tolerance Patient tolerated treatment well ?  ?Patient Left with call bell/phone within reach;in bed;with bed alarm set ?  ?Nurse  Communication Mobility status ?  ? ?   ? ?Time: 1121-1206 ?OT Time Calculation (min): 45 min ? ?Charges: OT General Charges ?$OT Visit: 1 Visit ?OT Treatments ?$Self Care/Home Management : 23-37 mins ?$Neuromuscular Re-education: 8-22 mins ? ?Jolaine Artist, OT ?Acute Rehabilitation Services ?Pager [phone] ?Office [phone] ? ? ?Delight Stare ?02/04/2022, 12:55 PM ?

## 2022-02-04 NOTE — Progress Notes (Signed)
Physical Therapy Treatment ?Patient Details ?Name: Krystal Sherman ?MRN: [id number] ?DOB: 07-25-1981 ?Today's Date: 02/04/2022 ? ? ?History of Present Illness Pt is a 41 y/o female presenting on 3/8 with L sided weakness. MRI with extensive acute infarction in R temporoparietal cortex and in superior R frontal cortext, roughly watershed pattern, R occupital cortex infract chronic (but new since last CVA).  PMH includes: CVA with residual R sided weakness, HTN, and polysubstance abuse. ? ?  ?PT Comments  ? ? Patient progressing towards physical therapy goals. Patient ambulated with supervision for safety due to L inattention/visual field deficits. Educated patient to have husband walk on her L side to assist for safety, patient verbalized understanding. Simulated floor transfer to practice for home environment in a tent. Patient eager to discharge home. Updated recommendation to OPPT at discharge to address balance and strength deficits.  ?  ?Recommendations for follow up therapy are one component of a multi-disciplinary discharge planning process, led by the attending physician.  Recommendations may be updated based on patient status, additional functional criteria and insurance authorization. ? ?Follow Up Recommendations ? Outpatient PT (insurance denied CIR) ?  ?  ?Assistance Recommended at Discharge Frequent or constant Supervision/Assistance  ?Patient can return home with the following A little help with walking and/or transfers;A little help with bathing/dressing/bathroom;Assistance with cooking/housework;Assist for transportation;Help with stairs or ramp for entrance ?  ?Equipment Recommendations ? None recommended by PT  ?  ?Recommendations for Other Services   ? ? ?  ?Precautions / Restrictions Precautions ?Precautions: Fall ?Precaution Comments: monitor BP ?Restrictions ?Weight Bearing Restrictions: No  ?  ? ?Mobility ? Bed Mobility ?  ?  ?  ?  ?  ?  ?  ?General bed mobility comments: in recliner on arrival ?   ? ?Transfers ?Overall transfer level: Modified independent ?Equipment used: None ?  ?  ?  ?  ?  ?  ?  ?General transfer comment: able to perform sit to stand transfer modI as well as floor transfer to simulate home living in tent. ?  ? ?Ambulation/Gait ?Ambulation/Gait assistance: Supervision ?Gait Distance (Feet): 300 Feet ?Assistive device: None ?Gait Pattern/deviations: Step-through pattern, Decreased stride length ?Gait velocity: decreased ?  ?  ?General Gait Details: supervision for safety due to L inattention and requiring cues to attend to L side. Patient unable to locate room on L but able to use contextual cues to realize she had missed room ? ? ?Stairs ?  ?  ?  ?  ?  ? ? ?Wheelchair Mobility ?  ? ?Modified Rankin (Stroke Patients Only) ?  ? ? ?  ?Balance Overall balance assessment: Needs assistance ?Sitting-balance support: No upper extremity supported, Feet supported ?Sitting balance-Leahy Scale: Fair ?  ?  ?Standing balance support: No upper extremity supported, During functional activity ?Standing balance-Leahy Scale: Fair ?Standing balance comment: dynamically ?  ?  ?  ?  ?  ?  ?  ?  ?  ?  ?  ?  ? ?  ?Cognition Arousal/Alertness: Awake/alert ?Behavior During Therapy: [facility] for tasks assessed/performed ?Overall Cognitive Status: No family/caregiver present to determine baseline cognitive functioning ?Area of Impairment: Attention, Memory, Following commands, Safety/judgement, Awareness, Problem solving ?  ?  ?  ?  ?  ?  ?  ?  ?  ?Current Attention Level: Sustained ?Memory: Decreased short-term memory, Decreased recall of precautions ?Following Commands: Follows one step commands consistently, Follows one step commands with increased time, Follows multi-step commands inconsistently ?Safety/Judgement: Decreased awareness of safety,  Decreased awareness of deficits ?Awareness: Emergent ?Problem Solving: Slow processing, Requires verbal cues ?  ?  ?  ? ?  ?Exercises   ? ?  ?General Comments General comments  (skin integrity, edema, etc.): reviewed exercises to L hand and provided handout.  Requires anchor to read and scan completely to L side of exercises. Provided handout for visual deficits and safety. ?  ?  ? ?Pertinent Vitals/Pain Pain Assessment ?Pain Assessment: Faces ?Faces Pain Scale: No hurt  ? ? ?Home Living   ?  ?  ?  ?  ?  ?  ?  ?  ?  ?   ?  ?Prior Function    ?  ?  ?   ? ?PT Goals (current goals can now be found in the care plan section) Acute Rehab PT Goals ?PT Goal Formulation: Patient unable to participate in goal setting ?Time For Goal Achievement: 02/12/22 ?Potential to Achieve Goals: Good ?Progress towards PT goals: Progressing toward goals ? ?  ?Frequency ? ? ? Min 4X/week ? ? ? ?  ?PT Plan Current plan remains appropriate  ? ? ?Co-evaluation   ?  ?  ?  ?  ? ?  ?AM-PAC PT "6 Clicks" Mobility   ?Outcome Measure ? Help needed turning from your back to your side while in a flat bed without using bedrails?: None ?Help needed moving from lying on your back to sitting on the side of a flat bed without using bedrails?: None ?Help needed moving to and from a bed to a chair (including a wheelchair)?: None ?Help needed standing up from a chair using your arms (e.g., wheelchair or bedside chair)?: None ?Help needed to walk in hospital room?: A Little ?Help needed climbing 3-5 steps with a railing? : A Lot ?6 Click Score: 21 ? ?  ?End of Session   ?Activity Tolerance: Patient tolerated treatment well ?Patient left: in bed;with call bell/phone within reach ?Nurse Communication: Mobility status ?PT Visit Diagnosis: Unsteadiness on feet (R26.81);Difficulty in walking, not elsewhere classified (R26.2);Other symptoms and signs involving the nervous system (R29.898) ?  ? ? ?Time: 4098-1191 ?PT Time Calculation (min) (ACUTE ONLY): 11 min ? ?Charges:  $Gait Training: 8-22 mins          ?          ? ?[name] A. Dan Humphreys, PT, DPT ?Acute Rehabilitation Services ?Pager [phone] ?Office [phone] ? ? ? ?[name] [name] ?02/04/2022, 2:28 PM ? ?

## 2022-02-04 NOTE — TOC Transition Note (Signed)
Transition of Care (TOC) - CM/SW Discharge Note ? ? ?Patient Details  ?Name: Krystal Sherman ?MRN: [id number] ?Date of Birth: 28-Jun-1981 ? ?Transition of Care (TOC) CM/SW Contact:  ?Harriet Masson, RN ?Phone Number: ?02/04/2022, 2:02 PM ? ? ?Clinical Narrative:    ?Patient stable for discharge. OP Rehab referral sent. Information added to AVS. ?Patient's mother in law will take patient home and help her get to OP rehab. ? ? ?Final next level of care: OP Rehab ?Barriers to Discharge: Barriers Resolved ? ? ?Patient Goals and CMS Choice ?Patient states their goals for this hospitalization and ongoing recovery are:: Go home ?  ?  ? ?Discharge Placement ?  ?         Lives in tent with husband ?  ?  ?  ?  ? ?Discharge Plan and Services ?  ?  ?           ?  ? OP Rehab ?  ?  ?  ?  ?  ?  ?  ?  ? ?Social Determinants of Health (SDOH) Interventions ?  ? ? ?Readmission Risk Interventions ?Readmission Risk Prevention Plan 02/04/2022  ?Post Dischage Appt Complete  ?Medication Screening Complete  ?Transportation Screening Complete  ?Some recent data might be hidden  ? ? ? ? ? ?

## 2022-02-04 NOTE — Progress Notes (Signed)
Patient says her boyfriend will be unable to pick her up by 5pm. He will come after 5pm. Notified charge nurse, Jenny Reichmann.  ?

## 2022-02-04 NOTE — Progress Notes (Signed)
Patient in bed. Asked if she knew when her transportation would be coming stated, "I think his mom is on the way." Call light within reach, bed in lowest position, charge nurse John aware.  ?

## 2022-02-04 NOTE — Discharge Summary (Addendum)
? ?PATIENT DETAILS ?Name: Krystal Sherman ?Age: 41 y.o. ?Sex: female ?Date of Birth: 09-21-1981 ?MRN: [id number]. ?Admitting Physician: Jonetta Osgood, MD ?[id number], [place] ? ?Admit Date: 01/28/2022 ?Discharge date: 02/04/2022 ? ?Recommendations for Outpatient Follow-up:  ?Follow up with PCP in 1-2 weeks ?Please obtain CMP/CBC in one week ?Please ensure follow-up with neurology. ?Repeat hypercoagulable panel in 3 months. ?Aspirin/Plavix x3 months-followed by aspirin alone ? ?Admitted From:  ?Home ? ?Disposition: ?Outpatient PT/OT ?  ?Discharge Condition: ?good ? ?CODE STATUS: ?  Code Status: Full Code  ? ?Diet recommendation:  ?Diet Order   ? ?       ?  Diet - low sodium heart healthy       ?  ?  Diet Heart Room service appropriate? Yes; Fluid consistency: Thin  Diet effective now       ?  ? ?  ?  ? ?  ?  ? ?Brief Summary: ?41 year old with prior history of CVA, methamphetamine use-presented with left sided weakness-was found to have acute CVA.  See below for further details. ?  ?Significant Hospital events: ?3/8>> admit to [facility] for left-sided weakness x2 days-found to have acute CVA. ?  ?Significant studies: ?3/9>> MRI brain: Acute infarction involving right temporoparietal cortex, superior right frontal cortex and watershed pattern. ?3/9>> brain MRA: Degraded by motion-advanced intracranial stenosis with a flow gap in the right supraclinoid ICA and left carotid terminus. ?3/9>> neck MRA: Negative Motion degraded assessment ?3/9>> CT angio head/neck: Carotid artery/vertebral artery normal in the neck.  Irregular stenosis in the cavernous carotid bilaterally, segmental occlusion of left M1 segment, decreased flow in the left MCA branches.  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 ?3/9>> TTE: EF 55-60%, small pericardial effusion-no evidence of tamponade. ?3/9>> A1c: 4.7 ?3/9>> LDL: 91 ?3/9>> vitamin B12: 317 ?3/9>> CRP: 0.9 ?3/9>> ESR: 17 ?3/9>> ANA: Pending ?3/9>> hypercoagulable work-up: protein S activity low,  factor 5 leiden heterozygous ?3/10>> TEE: Positive bubble study consistent with PFO ?3/10>> lower extremity Dopplers: No DVT ?  ?Significant microbiology data: ?3/8>> COVID/influenza PCR: Negative ?  ?Procedures: ?None ?  ?Consults:  ?Neurology ? ?Brief Hospital Course: ?Acute CVA: Minimal left-sided weakness-work-up as above-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  Discussed hypercoagulable work-up findings with stroke MD Dr. Erlinda Hong on 3/15-recommendations are to repeat hypercoagulable panel in 3 months.  Stroke MD recommending aspirin/Plavix for 3 months followed by aspirin alone.  Remains on statin. This MD did a peer to peer with insurance MD for CIR-however was denied.  Transition of care now recommending outpatient PT/OT-Per social work-SNF not an option. ?  ?HTN: Initially permissive hypertension was allowed-stable on amlodipine and chlorthalidone.   ?  ?Borderline vitamin B12 deficiency: Continue supplementation on discharge.  Recheck B12 levels in 3 months.   ?  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 ?  ?History of cocaine use: Claims she does not do cocaine any longer-last use was several months back. ?  ?  ?BMI: ?Estimated body mass index is 35.26 kg/m? as calculated from the following: ?  Height as of this encounter: _0  (1.676 m). ?  Weight as of this encounter: 99.1 kg.  ?  ?  ?Discharge Diagnoses:  ?Principal Problem: ?  Acute ischemic stroke ([place]) ?Active Problems: ?  Acute CVA (cerebrovascular accident) ([place]) ?  Essential hypertension ?  Factor 5 Leiden mutation, heterozygous ([place]) ? ? ?Discharge Instructions: ? ?Activity:  ?As tolerated with Full fall precautions use walker/cane & assistance as needed ? ? ?  Discharge Instructions   ? ? Ambulatory referral to Neurology   Complete by: As directed ?  ? Follow up with stroke clinic NP (Jessica [place] or Cecille Rubin, if both not available, consider Zachery Dauer, or [place])  at [facility] in about 4 weeks. Thanks.  ? Ambulatory referral to Physical Therapy   Complete by: As directed ?  ? Needs Pt and OT  ? Call MD for:  extreme fatigue   Complete by: As directed ?  ? Call MD for:  persistant dizziness or light-headedness   Complete by: As directed ?  ? Diet - low sodium heart healthy   Complete by: As directed ?  ? Discharge instructions   Complete by: As directed ?  ? Follow with Primary MD  [facility] in 1-2 weeks ? ?Stop amphetamine/cocaine and tobacco use. ? ?Please get a complete blood count and chemistry panel checked by your Primary MD at your next visit, and again as instructed by your Primary MD. ? ?Get Medicines reviewed and adjusted: ?Please take all your medications with you for your next visit with your Primary MD ? ?Laboratory/radiological data: ?Please request your Primary MD to go over all hospital tests and procedure/radiological results at the follow up, please ask your Primary MD to get all Hospital records sent to his/her office. ? ?In some cases, they will be blood work, cultures and biopsy results pending at the time of your discharge. Please request that your primary care M.D. follows up on these results. ? ?Also Note the following: ?If you experience worsening of your admission symptoms, develop shortness of breath, life threatening emergency, suicidal or homicidal thoughts you must seek medical attention immediately by calling 911 or calling your MD immediately  if symptoms less severe. ? ?You must read complete instructions/literature along with all the possible adverse reactions/side effects for all the Medicines you take and that have been prescribed to you. Take any new Medicines after you have completely understood and accpet all the possible adverse reactions/side effects.  ? ?Do not drive when taking Pain medications or sleeping medications (Benzodaizepines) ? ?Do not take more than prescribed Pain, Sleep and Anxiety Medications. It is not  advisable to combine anxiety,sleep and pain medications without talking with your primary care practitioner ? ?Special Instructions: If you have smoked or chewed Tobacco  in the last 2 yrs please stop smoking, stop any regular Alcohol  and or any Recreational drug use. ? ?Wear Seat belts while driving. ? ?Please note: ?You were cared for by a hospitalist during your hospital stay. Once you are discharged, your primary care physician will handle any further medical issues. Please note that NO REFILLS for any discharge medications will be authorized once you are discharged, as it is imperative that you return to your primary care physician (or establish a relationship with a primary care physician if you do not have one) for your post hospital discharge needs so that they can reassess your need for medications and monitor your lab values.  ? Increase activity slowly   Complete by: As directed ?  ? ?  ? ?Allergies as of 02/04/2022   ? ?   Reactions  ? Acetaminophen   ? Oxycodone Hcl   ? Oxycodone-acetaminophen Nausea Only  ? ?  ? ?  ?Medication List  ?  ? ?TAKE these medications   ? ?amLODipine 10 MG tablet ?Commonly known as: NORVASC ?Take 1 tablet (10 mg total) by mouth daily. ?Start taking on: February 05, 2022 ?  ?  aspirin 81 MG EC tablet ?Take 1 tablet (81 mg total) by mouth daily. Swallow whole. ?Start taking on: February 05, 2022 ?  ?atorvastatin 40 MG tablet ?Commonly known as: LIPITOR ?Take 1 tablet (40 mg total) by mouth daily. ?Start taking on: February 05, 2022 ?  ?chlorthalidone 25 MG tablet ?Commonly known as: HYGROTON ?Take 1 tablet (25 mg total) by mouth daily. ?Start taking on: February 05, 2022 ?  ?clopidogrel 75 MG tablet ?Commonly known as: PLAVIX ?Take 1 tablet (75 mg total) by mouth daily. ?Start taking on: February 05, 2022 ?  ?vitamin B-12 1000 MCG tablet ?Commonly known as: CYANOCOBALAMIN ?Take 1 tablet (1,000 mcg total) by mouth daily. ?  ? ?  ? ? Follow-up Information   ? ? [facility].  Schedule an appointment as soon as possible for a visit in 1 month(s).   ?Specialty: Neurology ?Why: stroke clinic, Office will call with date/time, If you dont hear from them,please give them a call ?Contact in

## 2022-02-04 NOTE — Progress Notes (Signed)
Patient alert and oriented x4. Breathing is even and nonlabored. Educated patient on discharge instructions, stated understanding. Removed PIV with no issues. Patient got dressed independently. Awaiting arrival of ride home. ? ?

## 2022-02-04 NOTE — Progress Notes (Signed)
Asked patient if she has heard anything from her significant other states, "He is waiting on a ride so he can come get me." Informed patient to let me know when her ride is coming.  ? ?

## 2022-02-04 NOTE — Progress Notes (Signed)
Inpatient Rehab Admissions Coordinator:  ? ? Pt.'s insurance denied CIR. CIR will sign off.  ? ?Clemens Catholic, MS, CCC-SLP ?Rehab Admissions Coordinator  ?[phone] (celll) ?[phone] (office) ? ?

## 2022-02-04 NOTE — Progress Notes (Signed)
Patient still waiting on a ride.  Unsure if she will be able to get one this evening.  Charge RN aware.   ?

## 2022-02-04 NOTE — Progress Notes (Signed)
Patient says her boyfriend will be unable to pick her up until he is off from work. Asked patient to see if he can pick her up from discharge lounge by 1700. ?

## 2022-02-05 LAB — BASIC METABOLIC PANEL
Anion gap: 15 (ref 5–15)
BUN: 14 mg/dL (ref 6–20)
CO2: 24 mmol/L (ref 22–32)
Calcium: 9.7 mg/dL (ref 8.9–10.3)
Chloride: 96 mmol/L — ABNORMAL LOW (ref 98–111)
Creatinine, Ser: 0.82 mg/dL (ref 0.44–1.00)
GFR, Estimated: >60 mL/min (ref >60)
Glucose, Bld: 84 mg/dL (ref 70–99)
Potassium: 3 mmol/L — ABNORMAL LOW (ref 3.5–5.1)
Sodium: 135 mmol/L (ref 135–145)

## 2022-02-05 LAB — HCV INTERPRETATION

## 2022-02-05 LAB — HCV AB W REFLEX TO QUANT PCR: HCV Ab: NONREACTIVE

## 2022-02-05 MED ORDER — POTASSIUM CHLORIDE 20 MEQ PO PACK
40.0000 meq | PACK | Freq: Once | ORAL | Status: AC
  Administered 2022-02-05: 40 meq via ORAL
  Filled 2022-02-05 (×2): qty 2

## 2022-02-05 NOTE — Progress Notes (Signed)
Seen and examined-no major issues overnight-she is still trying to arrange a ride to her home in [place].  She is hopeful that her husband will be able to arrange a ride.  Discussed with RN staff-no major issues overnight.  She remains stable for discharge. ?

## 2022-02-05 NOTE — Progress Notes (Signed)
Occupational Therapy Treatment Patient Details Name: Krystal Sherman MRN: [id number] DOB: 07-09-1981 Today's Date: 02/05/2022   History of present illness Pt is a 41 y/o female presenting on 3/8 with L sided weakness. MRI with extensive acute infarction in R temporoparietal cortex and in superior R frontal cortext, roughly watershed pattern, R occupital cortex infract chronic (but new since last CVA).  PMH includes: CVA with residual R sided weakness, HTN, and polysubstance abuse.   OT comments  Pt with improving awareness of L sided deficits and able to recall safety strategies (having someone walk on L side, adequately scanning environment, etc). Pt able to mobilize on Supervision level basis without device and without significant safety concerns. Pt with improving recall with trail making tasks and implementing safety strategies without cues (see cognition section for details). Reinforced visual and fine motor exercises handout with pt denying concerns. DC recs remain appropriate.    Recommendations for follow up therapy are one component of a multi-disciplinary discharge planning process, led by the attending physician.  Recommendations may be updated based on patient status, additional functional criteria and insurance authorization.    Follow Up Recommendations  Outpatient OT (insurance denied CIR)    Assistance Recommended at Discharge Intermittent Supervision/Assistance  Patient can return home with the following  A little help with walking and/or transfers;A little help with bathing/dressing/bathroom;Assistance with cooking/housework;Direct supervision/assist for medications management;Direct supervision/assist for financial management;Assist for transportation;Help with stairs or ramp for entrance   Equipment Recommendations  None recommended by OT    Recommendations for Other Services      Precautions / Restrictions Precautions Precautions: Fall Precaution Comments: monitor BP,  impaired L peripheral vision Restrictions Weight Bearing Restrictions: No       Mobility Bed Mobility Overal bed mobility: Modified Independent                  Transfers Overall transfer level: Modified independent Equipment used: None Transfers: Sit to/from Stand Sit to Stand: Modified independent (Device/Increase time)                 Balance Overall balance assessment: Needs assistance Sitting-balance support: No upper extremity supported, Feet supported Sitting balance-Leahy Scale: Good     Standing balance support: No upper extremity supported, During functional activity Standing balance-Leahy Scale: Fair Standing balance comment: able to mobilize without AD, dynamic limitations likely; approaching Good balance                           ADL either performed or assessed with clinical judgement   ADL Overall ADL's : Needs assistance/impaired     Grooming: Set up;Standing               Lower Body Dressing: Set up;Sit to/from stand               Functional mobility during ADLs: Supervision/safety General ADL Comments: Improving mobility without LOB, minimal to no cues for L sided attention with mobility, not bumping into items. Reinforced scanning activities (word searches, etc as well), fine motor coordination tasks with reference to handouts in room. Collab on ADL/IADL rotuine with pt reporting not doing much besides sitting in tent (may fold blankets, call family on phone) or sitting in chair outside of tent; does not mobilize much d/t placement of tent on a hill    Extremity/Trunk Assessment Upper Extremity Assessment Upper Extremity Assessment: RUE deficits/detail;LUE deficits/detail RUE Deficits / Details: baseline from prior CVA with weakness, decreased  coordination, and sesnsation.  Grossly 3-/5 MMT. reports unable to hold pen or write with this previously dominant UE RUE Sensation: decreased light touch RUE Coordination:  decreased fine motor;decreased gross motor LUE Deficits / Details: inattention to UE, grossly 3+/5 MMT with decreased coordination though better than R UE. pt able to open small body wash and toothpaste containers with increased time LUE Sensation: decreased proprioception LUE Coordination: decreased fine motor;decreased gross motor   Lower Extremity Assessment Lower Extremity Assessment: Defer to PT evaluation        Vision   Vision Assessment?: Yes Additional Comments: decreased L peripheral visual field, implementing scanning techniques well, able to read whiteboard in room without difficulty   Perception     Praxis      Cognition Arousal/Alertness: Awake/alert Behavior During Therapy: WFL for tasks assessed/performed Overall Cognitive Status: No family/caregiver present to determine baseline cognitive functioning Area of Impairment: Attention, Memory, Following commands, Safety/judgement, Awareness, Problem solving                   Current Attention Level: Selective Memory: Decreased short-term memory Following Commands: Follows one step commands consistently, Follows one step commands with increased time, Follows multi-step commands inconsistently Safety/Judgement: Decreased awareness of safety, Decreased awareness of deficits Awareness: Emergent Problem Solving: Slow processing, Requires verbal cues General Comments: Improving awareness of deficits, able to recall education of having someone walk on L side, visual strategies (scanning). Pt able to complete trail making task to locate 3/3 items and able to recall without cues        Exercises      Shoulder Instructions       General Comments Reports her brother plans to pick her up after he gets off of work this afternoon    Pertinent Vitals/ Pain       Pain Assessment Pain Assessment: Faces Faces Pain Scale: No hurt  Home Living                                          Prior  Functioning/Environment              Frequency  Min 2X/week        Progress Toward Goals  OT Goals(current goals can now be found in the care plan section)  Progress towards OT goals: Progressing toward goals  Acute Rehab OT Goals Patient Stated Goal: recover vision OT Goal Formulation: With patient Time For Goal Achievement: 02/12/22 Potential to Achieve Goals: Good ADL Goals Pt Will Perform Grooming: with supervision;standing Pt Will Perform Lower Body Dressing: with supervision;sit to/from stand Pt Will Transfer to Toilet: with supervision;ambulating Pt Will Perform Toileting - Clothing Manipulation and hygiene: with supervision;sit to/from stand Pt/caregiver will Perform Home Exercise Program: Increased strength;Both right and left upper extremity;With written HEP provided Additional ADL Goal #1: Pt will locate and 3/5 items on L side during ADL task at sink with no more than minimal cueing. Additional ADL Goal #2: Pt will complete 3 step trail making task with supervision, using compensatory techniques as needed for problem solving.  Plan Frequency remains appropriate;Discharge plan needs to be updated    Co-evaluation                 AM-PAC OT "6 Clicks" Daily Activity     Outcome Measure   Help from another person eating meals?: A Little Help from another person taking  care of personal grooming?: A Little Help from another person toileting, which includes using toliet, bedpan, or urinal?: A Little Help from another person bathing (including washing, rinsing, drying)?: A Little Help from another person to put on and taking off regular upper body clothing?: A Little Help from another person to put on and taking off regular lower body clothing?: A Little 6 Click Score: 18    End of Session Equipment Utilized During Treatment: Gait belt  OT Visit Diagnosis: Other abnormalities of gait and mobility (R26.89);Muscle weakness (generalized) (M62.81);Other  symptoms and signs involving cognitive function;Other symptoms and signs involving the nervous system (R29.898);Low vision, both eyes (H54.2)   Activity Tolerance Patient tolerated treatment well   Patient Left in bed;with call bell/phone within reach   Nurse Communication Mobility status        Time: 3557-3220 OT Time Calculation (min): 16 min  Charges: OT General Charges $OT Visit: 1 Visit OT Treatments $Self Care/Home Management : 8-22 mins  Bradd Canary, OTR/L Acute Rehab Services Office: [phone]   Lorre Munroe 02/05/2022, 10:29 AM

## 2022-02-05 NOTE — TOC Transition Note (Signed)
Transition of Care (TOC) - CM/SW Discharge Note ? ? ?Patient Details  ?Name: Krystal Sherman ?MRN: [id number] ?Date of Birth: 1981-09-11 ? ?Transition of Care (TOC) CM/SW Contact:  ?Harriet Masson, RN ?Phone Number: ?02/05/2022, 1:37 PM ? ? ?Clinical Narrative:    ?Family didn't pick up patient yesterday. Taxi voucher given to patient. Approved by management. Taxi taking patient to daughter's address.  ? ? ? ?Final next level of care: OP Rehab ?Barriers to Discharge: Barriers Resolved ? ? ?Patient Goals and CMS Choice ?Patient states their goals for this hospitalization and ongoing recovery are:: Go home ?  ?  ? ?Discharge Placement ?  ?          Home in tent ?  ?  ?  ?  ? ?Discharge Plan and Services ?  ? Home with OP rehab ?           ?  ?  ?  ?  ?  ?  ?  ?  ?  ?  ? ?Social Determinants of Health (SDOH) Interventions ?  ? ? ?Readmission Risk Interventions ?Readmission Risk Prevention Plan 02/04/2022  ?Post Dischage Appt Complete  ?Medication Screening Complete  ?Transportation Screening Complete  ?Some recent data might be hidden  ? ? ? ? ? ?

## 2022-02-09 ENCOUNTER — Other Ambulatory Visit (HOSPITAL_COMMUNITY): Payer: Self-pay

## 2022-02-09 ENCOUNTER — Telehealth (HOSPITAL_COMMUNITY): Payer: Self-pay

## 2022-02-09 NOTE — Telephone Encounter (Signed)
Transitions of Care Pharmacy   Call attempted for a pharmacy transitions of care follow-up. Unable to leave voicemail.  Call attempt #1. Will follow-up in 2-3 days.    

## 2022-02-10 ENCOUNTER — Telehealth (HOSPITAL_COMMUNITY): Payer: Self-pay

## 2022-02-10 NOTE — Telephone Encounter (Signed)
Transitions of Care Pharmacy   Call attempted for a pharmacy transitions of care follow-up. Unable to leave voicemail.   Call attempt #2. Will follow-up in 2-3 days.    

## 2022-02-11 ENCOUNTER — Telehealth (HOSPITAL_COMMUNITY): Payer: Self-pay

## 2022-02-11 NOTE — Telephone Encounter (Signed)
Transitions of Care Pharmacy   Call attempted for a pharmacy transitions of care follow-up. Unable to leave voicemail.  Call attempt #3. Will no longer attempt follow up for TOC pharmacy   

## 2022-03-03 ENCOUNTER — Emergency Department (HOSPITAL_COMMUNITY)
Admission: EM | Admit: 2022-03-03 | Discharge: 2022-03-04 | Disposition: A | Discharge status: Home / Self Care | Payer: Medicare Other | Attending: Physician Assistant | Admitting: Physician Assistant

## 2022-03-03 DIAGNOSIS — F19239 Other psychoactive substance dependence with withdrawal, unspecified: Secondary | ICD-10-CM | POA: Insufficient documentation

## 2022-03-03 DIAGNOSIS — Z5321 Procedure and treatment not carried out due to patient leaving prior to being seen by health care provider: Secondary | ICD-10-CM | POA: Insufficient documentation

## 2022-03-03 DIAGNOSIS — Z5900 Homelessness unspecified: Secondary | ICD-10-CM | POA: Insufficient documentation

## 2022-03-03 MED ORDER — AMLODIPINE BESYLATE 5 MG PO TABS: 10.0000 mg | ORAL_TABLET | Freq: Once | ORAL | Status: DC

## 2022-03-03 MED ORDER — CHLORTHALIDONE 25 MG PO TABS
25.0000 mg | ORAL_TABLET | Freq: Every day | ORAL | Status: DC
  Filled 2022-03-03: qty 1

## 2022-03-03 NOTE — ED Triage Notes (Signed)
Pt requesting help detoxing from meth; last use was yesterday. States she's "tired of being homeless, tired of using it, just tired." Denies associated withdrawal symptoms. Pt tearful in triage, denies SI/HI ?

## 2022-03-03 NOTE — ED Notes (Signed)
Pt not responding to call for room ?

## 2022-03-03 NOTE — ED Provider Triage Note (Signed)
Emergency Medicine Provider Triage Evaluation Note ? ?Krystal Sherman , a 41 y.o. female  was evaluated in triage.  Pt complains of wanting withdrawal from meth.  She states she has been using for two years.  She states her last use was yesterday.  She states she has no SI or HI.  She states she is just tired of using it.  ? ?She is not having any physical symptoms.  ? ?She has not tried to go to rehab previously.  ? ?Physical Exam  ?BP (!) 196/118 (BP Location: Right Arm)   Pulse 94   Temp 98.6 ?F (37 ?C) (Oral)   Resp 16   SpO2 99%  ?Gen:   Awake, no distress   ?Resp:  Normal effort  ?MSK:   Moves extremities without difficulty  ?Other:   ? ?Medical Decision Making  ?Medically screening exam initiated at 7:01 PM.  Appropriate orders placed.  Krystal Sherman was informed that the remainder of the evaluation will be completed by another provider, this initial triage assessment does not replace that evaluation, and the importance of remaining in the ED until their evaluation is complete. ? ? ?  ?Lorin Glass, [place] ?03/03/22 1903 ? ?

## 2022-03-11 DIAGNOSIS — I6389 Other cerebral infarction: Secondary | ICD-10-CM
